# Patient Record
Sex: Male | Born: 1990 | Race: White | Hispanic: No | Marital: Single | Smoking: Former smoker
Health system: Southern US, Community
[De-identification: ages and names within clinical notes are randomized; demographics above are authoritative.]

---
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
## 2012-03-17 ENCOUNTER — Emergency Department (HOSPITAL_COMMUNITY)
Admission: EM | Admit: 2012-03-17 | Discharge: 2012-03-17 | Discharge status: Absent without leave | Payer: 59 | Source: Home / Self Care | Attending: Emergency Medicine | Admitting: Emergency Medicine

## 2012-03-17 NOTE — ED Notes (Signed)
Pt called x 1 no answer

## 2015-09-04 ENCOUNTER — Ambulatory Visit (INDEPENDENT_AMBULATORY_CARE_PROVIDER_SITE_OTHER): Payer: 59

## 2015-09-04 ENCOUNTER — Ambulatory Visit (INDEPENDENT_AMBULATORY_CARE_PROVIDER_SITE_OTHER): Payer: 59 | Admitting: Family Medicine

## 2015-09-04 ENCOUNTER — Encounter: Payer: Self-pay | Admitting: Physician Assistant

## 2015-09-04 VITALS — BP 136/86 | HR 111 | Temp 98.2°F | Resp 16 | Ht 70.25 in | Wt 280.2 lb

## 2015-09-04 DIAGNOSIS — R059 Cough, unspecified: Secondary | ICD-10-CM

## 2015-09-04 DIAGNOSIS — J209 Acute bronchitis, unspecified: Secondary | ICD-10-CM | POA: Diagnosis not present

## 2015-09-04 DIAGNOSIS — R05 Cough: Secondary | ICD-10-CM

## 2015-09-04 DIAGNOSIS — J9801 Acute bronchospasm: Secondary | ICD-10-CM | POA: Diagnosis not present

## 2015-09-04 LAB — POCT CBC
Granulocyte percent: 61.9 %G (ref 37–80)
HCT, POC: 54.8 % — AB (ref 43.5–53.7)
Hemoglobin: 19.1 g/dL — AB (ref 14.1–18.1)
Lymph, poc: 4.4 — AB (ref 0.6–3.4)
MCH, POC: 30.5 pg (ref 27–31.2)
MCHC: 35 g/dL (ref 31.8–35.4)
MCV: 87.1 fL (ref 80–97)
MID (cbc): 0.6 (ref 0–0.9)
MPV: 8.2 fL (ref 0–99.8)
POC Granulocyte: 8.2 — AB (ref 2–6.9)
POC LYMPH PERCENT: 33.5 %L (ref 10–50)
POC MID %: 4.6 %M (ref 0–12)
Platelet Count, POC: 235 10*3/uL (ref 142–424)
RBC: 6.28 M/uL — AB (ref 4.69–6.13)
RDW, POC: 12.3 %
WBC: 13.2 10*3/uL — AB (ref 4.6–10.2)

## 2015-09-04 MED ORDER — ALBUTEROL SULFATE (2.5 MG/3ML) 0.083% IN NEBU
2.5000 mg | INHALATION_SOLUTION | Freq: Once | RESPIRATORY_TRACT | Status: AC
  Administered 2015-09-04: 2.5 mg via RESPIRATORY_TRACT

## 2015-09-04 MED ORDER — AZITHROMYCIN 250 MG PO TABS: ORAL_TABLET | ORAL | Status: AC

## 2015-09-04 MED ORDER — ALBUTEROL SULFATE HFA 108 (90 BASE) MCG/ACT IN AERS: 2.0000 | INHALATION_SPRAY | RESPIRATORY_TRACT | Status: DC | PRN

## 2015-09-04 MED ORDER — HYDROCOD POLST-CPM POLST ER 10-8 MG/5ML PO SUER: 5.0000 mL | Freq: Two times a day (BID) | ORAL | Status: DC | PRN

## 2015-09-04 MED ORDER — AZITHROMYCIN 250 MG PO TABS: ORAL_TABLET | ORAL | Status: DC

## 2015-09-04 MED ORDER — IPRATROPIUM BROMIDE 0.02 % IN SOLN
0.5000 mg | Freq: Once | RESPIRATORY_TRACT | Status: AC
  Administered 2015-09-04: 0.5 mg via RESPIRATORY_TRACT

## 2015-09-04 NOTE — Patient Instructions (Signed)
Drink plenty of water (64 oz/day) and get plenty of rest. If you have been prescribed a cough syrup, do not drive or operate heavy machinery while using this medication. Take antibiotic until finished. Use albuterol 2 puffs every 4 hours as needed for shortness of breath, wheezing, chest tightness If your symptoms are not improving in 7-10 days, call and let me know.

## 2015-09-04 NOTE — Progress Notes (Signed)
[facility] [address], [place] [address] [phone]  Date:  09/04/2015   Name:  Eric Payne Eric Payne   DOB:  [birth year]   MRN:  [id number]  PCP:  No primary care provider on file.    Chief Complaint: Cough and Nasal Congestion   History of Present Illness:  This is a 24 y.o. male who is presenting with cough x 1 week. Productive of green mucus. He is here with his mother who states he has been coughing nonstop and is getting no sleep. He is having wheezing and chest tightness. He had asthma as a baby but no problems since unless around a bonfire. He has never had to have an inhaler (since he was a baby) and never had to have a breathing treatment. He is also having mild sore throat, bilateral ear fullness, nasal congestion. He has not had fever but has felt hot. He gets wet and clammy but mom has been checking his temp and normal. Taking nyquil, dayquil, delsym and nothing helping. Has env allergies and takes zyrtec as needed. He is not a smoker but does chew tobacco.  Review of Systems:  Review of Systems See HPI  There are no active problems to display for this patient.   Prior to Admission medications   Medication Sig Start Date End Date Taking? Authorizing Provider  Cetirizine HCl (ZYRTEC PO) Take by mouth as needed.   Yes Historical Provider, MD  OVER THE COUNTER MEDICATION - see hpi as needed.   Yes Historical Provider, MD    No Known Allergies  History reviewed. No pertinent past surgical history.  Social History  Substance Use Topics  . Smoking status: Former Smoker -- 1 years    Types: Cigars, Cigarettes  . Smokeless tobacco: Current User    Types: Chew     Comment: chewing for 6 years  . Alcohol Use: 0.0 oz/week    0 Standard drinks or equivalent per week     Comment: beer    History reviewed. No pertinent family history.  Medication list has been reviewed and updated.  Physical Examination:  Physical Exam  Constitutional: He is  oriented to person, place, and time. He appears well-developed and well-nourished. No distress.  HENT:  Head: Normocephalic and atraumatic.  Right Ear: Hearing, external ear and ear canal normal. A middle ear effusion is present.  Left Ear: Hearing, external ear and ear canal normal. A middle ear effusion is present.  Nose: Nose normal.  Mouth/Throat: Uvula is midline and mucous membranes are normal. Posterior oropharyngeal erythema present. No oropharyngeal exudate or posterior oropharyngeal edema.  Eyes: Conjunctivae and lids are normal. Right eye exhibits no discharge. Left eye exhibits no discharge. No scleral icterus.  Cardiovascular: Regular rhythm, normal heart sounds and normal pulses.  Tachycardia present.   No murmur heard. Pulmonary/Chest: Effort normal. No respiratory distress. He has decreased breath sounds (decreased breath sounds at bilateral bases). He has no wheezes. He has no rhonchi. He has no rales.  Some wheezing in bilateral bases after duoneb  Musculoskeletal: Normal range of motion.  Lymphadenopathy:       Head (right side): No submental, no submandibular and no tonsillar adenopathy present.       Head (left side): No submental, no submandibular and no tonsillar adenopathy present.    He has no cervical adenopathy.  Neurological: He is alert and oriented to person, place, and time.  Skin: Skin is warm, dry and intact.  Skin diaphoretic  and flushed  Psychiatric: He has a normal mood and affect. His speech is normal and behavior is normal. Thought content normal.   BP 136/86 mmHg  Pulse 111  Temp(Src) 98.2 F (36.8 C) (Oral)  Resp 16  Ht 5' 10.25" (1.784 m)  Wt 280 lb 3.2 oz (127.098 kg)  BMI 39.93 kg/m2  SpO2 93%  O2 sats 93-94% after duoneb treatment  [facility] reading (PRIMARY) by  Dr. Katrinka Blazing: negative.  Results for orders placed or performed in visit on 09/04/15  POCT CBC  Result Value Ref Range   WBC 13.2 (A) 4.6 - 10.2 K/uL   Lymph, poc 4.4 (A) 0.6 - 3.4    POC LYMPH PERCENT 33.5 10 - 50 %L   MID (cbc) 0.6 0 - 0.9   POC MID % 4.6 0 - 12 %M   POC Granulocyte 8.2 (A) 2 - 6.9   Granulocyte percent 61.9 37 - 80 %G   RBC 6.28 (A) 4.69 - 6.13 M/uL   Hemoglobin 19.1 (A) 14.1 - 18.1 g/dL   HCT, POC 16.1 (A) 09.6 - 53.7 %   MCV 87.1 80 - 97 fL   MCH, POC 30.5 27 - 31.2 pg   MCHC 35.0 31.8 - 35.4 g/dL   RDW, POC 04.5 %   Platelet Count, POC 235 142 - 424 K/uL   MPV 8.2 0 - 99.8 fL   Assessment and Plan:  1. Acute bronchitis, unspecified organism 2. Cough 3. Bronchospasm Radiograph negative. Wbc 13.2 with left shift. Symptoms improved some after duoneb treatment. O2 sat remained about the same at 93-94%... Suspect could be d/t calloused fingers with black material around fingernails (he's a Teaching laboratory technician). Will treat with zpak, tussionex and albuterol. If not getting better in 1 week, he will let me know. - chlorpheniramine-HYDROcodone (TUSSIONEX PENNKINETIC ER) 10-8 MG/5ML SUER; Take 5 mLs by mouth every 12 (twelve) hours as needed for cough.  Dispense: 100 mL; Refill: 0 - azithromycin (ZITHROMAX) 250 MG tablet; Take 2 tabs PO x 1 dose, then 1 tab PO QD x 4 days  Dispense: 6 tablet; Refill: 0 - DG Chest 2 View; Future - albuterol (PROVENTIL) (2.5 MG/3ML) 0.083% nebulizer solution 2.5 mg; Take 3 mLs (2.5 mg total) by nebulization once. - ipratropium (ATROVENT) nebulizer solution 0.5 mg; Take 2.5 mLs (0.5 mg total) by nebulization once. - POCT CBC - albuterol (PROVENTIL HFA;VENTOLIN HFA) 108 (90 BASE) MCG/ACT inhaler; Inhale 2 puffs into the lungs every 4 (four) hours as needed for wheezing or shortness of breath (cough, shortness of breath or wheezing.).  Dispense: 1 Inhaler; Refill: 1   Tydus Sanmiguel V. Dyke Brackett, MHS [facility]  09/04/2015

## 2015-10-04 NOTE — Progress Notes (Signed)
History and physical examinations reviewed in detail with PA Bush.  CXR reviewed during visit. Agree with assessment and plan. Viren Lebeau Paulita FujitaMartin Yanel Dombrosky, M.D. [facility]  [place] [address] [place], [place]  [address] [phone] phone [phone] fax

## 2015-11-29 DIAGNOSIS — S90852A Superficial foreign body, left foot, initial encounter: Secondary | ICD-10-CM | POA: Diagnosis not present

## 2015-12-06 DIAGNOSIS — S90852D Superficial foreign body, left foot, subsequent encounter: Secondary | ICD-10-CM | POA: Diagnosis not present

## 2015-12-18 MED FILL — VENTOLIN HFA 90 MCG INHALER: 108 (90 BAS | 25 days supply | Qty: 18 | Fill #0

## 2015-12-25 ENCOUNTER — Other Ambulatory Visit: Payer: Self-pay

## 2015-12-25 DIAGNOSIS — J9801 Acute bronchospasm: Secondary | ICD-10-CM

## 2015-12-25 MED ORDER — ALBUTEROL SULFATE HFA 108 (90 BASE) MCG/ACT IN AERS: 2.0000 | INHALATION_SPRAY | RESPIRATORY_TRACT | Status: DC | PRN

## 2016-05-31 DIAGNOSIS — S51811A Laceration without foreign body of right forearm, initial encounter: Secondary | ICD-10-CM | POA: Diagnosis not present

## 2016-11-07 DIAGNOSIS — L400 Psoriasis vulgaris: Secondary | ICD-10-CM | POA: Diagnosis not present

## 2016-12-04 MED FILL — VENTOLIN HFA 90 MCG INHALER: 108 (90 BAS | 17 days supply | Qty: 18 | Fill #0

## 2016-12-06 DIAGNOSIS — H938X3 Other specified disorders of ear, bilateral: Secondary | ICD-10-CM | POA: Insufficient documentation

## 2016-12-06 DIAGNOSIS — R0982 Postnasal drip: Secondary | ICD-10-CM | POA: Diagnosis not present

## 2016-12-06 DIAGNOSIS — J343 Hypertrophy of nasal turbinates: Secondary | ICD-10-CM | POA: Insufficient documentation

## 2016-12-06 DIAGNOSIS — R0683 Snoring: Secondary | ICD-10-CM | POA: Insufficient documentation

## 2016-12-06 DIAGNOSIS — F17290 Nicotine dependence, other tobacco product, uncomplicated: Secondary | ICD-10-CM | POA: Diagnosis not present

## 2017-02-27 DIAGNOSIS — J343 Hypertrophy of nasal turbinates: Secondary | ICD-10-CM | POA: Diagnosis not present

## 2017-02-27 DIAGNOSIS — R0982 Postnasal drip: Secondary | ICD-10-CM | POA: Diagnosis not present

## 2017-08-25 ENCOUNTER — Other Ambulatory Visit: Payer: Self-pay

## 2017-08-25 ENCOUNTER — Ambulatory Visit (INDEPENDENT_AMBULATORY_CARE_PROVIDER_SITE_OTHER): Payer: 59 | Admitting: Physician Assistant

## 2017-08-25 ENCOUNTER — Encounter: Payer: Self-pay | Admitting: Physician Assistant

## 2017-08-25 ENCOUNTER — Ambulatory Visit (INDEPENDENT_AMBULATORY_CARE_PROVIDER_SITE_OTHER): Payer: 59

## 2017-08-25 VITALS — BP 120/80 | HR 101 | Temp 99.1°F | Resp 16 | Ht 71.5 in | Wt 308.8 lb

## 2017-08-25 DIAGNOSIS — R062 Wheezing: Secondary | ICD-10-CM

## 2017-08-25 DIAGNOSIS — R05 Cough: Secondary | ICD-10-CM

## 2017-08-25 DIAGNOSIS — J209 Acute bronchitis, unspecified: Secondary | ICD-10-CM | POA: Diagnosis not present

## 2017-08-25 DIAGNOSIS — R079 Chest pain, unspecified: Secondary | ICD-10-CM | POA: Diagnosis not present

## 2017-08-25 DIAGNOSIS — R059 Cough, unspecified: Secondary | ICD-10-CM

## 2017-08-25 LAB — POCT CBC
Granulocyte percent: 60.7 %G (ref 37–80)
HEMATOCRIT: 43.8 % (ref 43.5–53.7)
Hemoglobin: 15.2 g/dL (ref 14.1–18.1)
Lymph, poc: 2.8 (ref 0.6–3.4)
MCH, POC: 30.2 pg (ref 27–31.2)
MCHC: 34.6 g/dL (ref 31.8–35.4)
MCV: 87.3 fL (ref 80–97)
MID (cbc): 0.6 (ref 0–0.9)
MPV: 7.6 fL (ref 0–99.8)
POC GRANULOCYTE: 5.2 (ref 2–6.9)
POC LYMPH %: 32.6 % (ref 10–50)
POC MID %: 6.7 % (ref 0–12)
Platelet Count, POC: 264 10*3/uL (ref 142–424)
RBC: 5.02 M/uL (ref 4.69–6.13)
RDW, POC: 11.9 %
WBC: 8.6 10*3/uL (ref 4.6–10.2)

## 2017-08-25 LAB — GLUCOSE, POCT (MANUAL RESULT ENTRY): POC GLUCOSE: 99 mg/dL (ref 70–99)

## 2017-08-25 MED ORDER — HYDROCODONE-HOMATROPINE 5-1.5 MG/5ML PO SYRP: 5.0000 mL | ORAL_SOLUTION | Freq: Three times a day (TID) | ORAL | 0 refills | Status: DC | PRN

## 2017-08-25 MED ORDER — IPRATROPIUM BROMIDE 0.02 % IN SOLN
0.5000 mg | Freq: Once | RESPIRATORY_TRACT | Status: AC
  Administered 2017-08-25: 0.5 mg via RESPIRATORY_TRACT

## 2017-08-25 MED ORDER — ALBUTEROL SULFATE (2.5 MG/3ML) 0.083% IN NEBU
2.5000 mg | INHALATION_SOLUTION | Freq: Once | RESPIRATORY_TRACT | Status: AC
  Administered 2017-08-25: 2.5 mg via RESPIRATORY_TRACT

## 2017-08-25 MED ORDER — BENZONATATE 100 MG PO CAPS: 100.0000 mg | ORAL_CAPSULE | Freq: Three times a day (TID) | ORAL | 0 refills | Status: DC | PRN

## 2017-08-25 MED ORDER — PREDNISONE 20 MG PO TABS: 40.0000 mg | ORAL_TABLET | Freq: Every day | ORAL | 0 refills | Status: AC

## 2017-08-25 NOTE — Patient Instructions (Addendum)
- We will treat this as a respiratory viral infection.  - I recommend you rest, drink plenty of fluids, eat light meals including soups.  - We are going to treat your underlying inflammation with oral prednisone. Prednisone is a steroid and can cause side effects such as headache, irritability, nausea, vomiting, increased heart rate, increased blood pressure, increased blood sugar, appetite changes, and insomnia. Please take tablets in the morning with a full meal to help decrease the chances of these side effects. - You may use cough syrup at night for your cough and sore throat, Tessalon pearls during the day. Be aware that cough syrup can definitely make you drowsy and sleepy so do not drive or operate any heavy machinery if it is affecting you during the day.  - You may also use Tylenol or ibuprofen over-the-counter for your sore throat.   - Please let me know if you are not seeing any improvement or get worse in 7-10 days.       Acute Bronchitis, Adult Acute bronchitis is when air tubes (bronchi) in the lungs suddenly get swollen. The condition can make it hard to breathe. It can also cause these symptoms:  A cough.  Coughing up clear, yellow, or green mucus.  Wheezing.  Chest congestion.  Shortness of breath.  A fever.  Body aches.  Chills.  A sore throat.  Follow these instructions at home: Medicines  Take over-the-counter and prescription medicines only as told by your doctor.  If you were prescribed an antibiotic medicine, take it as told by your doctor. Do not stop taking the antibiotic even if you start to feel better. General instructions  Rest.  Drink enough fluids to keep your pee (urine) clear or pale yellow.  Avoid smoking and secondhand smoke. If you smoke and you need help quitting, ask your doctor. Quitting will help your lungs heal faster.  Use an inhaler, cool mist vaporizer, or humidifier as told by your doctor.  Keep all follow-up visits as told  by your doctor. This is important. How is this prevented? To lower your risk of getting this condition again:  Wash your hands often with soap and water. If you cannot use soap and water, use hand sanitizer.  Avoid contact with people who have cold symptoms.  Try not to touch your hands to your mouth, nose, or eyes.  Make sure to get the flu shot every year.  Contact a doctor if:  Your symptoms do not get better in 2 weeks. Get help right away if:  You cough up blood.  You have chest pain.  You have very bad shortness of breath.  You become dehydrated.  You faint (pass out) or keep feeling like you are going to pass out.  You keep throwing up (vomiting).  You have a very bad headache.  Your fever or chills gets worse. This information is not intended to replace advice given to you by your health care provider. Make sure you discuss any questions you have with your health care provider. Document Released: 03/11/2008 Document Revised: 05/01/2016 Document Reviewed: 03/13/2016 Elsevier Interactive Patient Education  2017 [employer]     IF you received an x-ray today, you will receive an invoice from [facility]. Please contact [facility] at [phone] with questions or concerns regarding your invoice.   IF you received labwork today, you will receive an invoice from [facility]. Please contact [facility] at [phone] with questions or concerns regarding your invoice.   Our billing staff  will not be able to assist you with questions regarding bills from these companies.  You will be contacted with the lab results as soon as they are available. The fastest way to get your results is to activate your My Chart account. Instructions are located on the last page of this paperwork. If you have not heard from [place] regarding the results in 2 weeks, please contact this office.

## 2017-08-25 NOTE — Progress Notes (Signed)
MRN: [id number] DOB: 1991-09-17  Subjective:   Eric Payne is a 26 y.o. male presenting for chief complaint of Cough (x 1 month nonproductive and sorethroat from coughing) and Chest Pain (x 2 days) .  Reports 2-3 week history of nonproductive cough. Has associated subjective Fever (subjective), sinus congestion, rhinorrhea, ear pain, sore throat, wheezing, shortness of breath and chest pain, chills, nausea and vomiting. Has tried albuterol inhaler,  mucincex, robitussin, cough drops, sudafed with no full relief. Denies  abdominal pain and diarrhea. Has not had sick contact with anyone. Has history of seasonal allergies, no history of asthma. Patient did not get flu shot this season. Current smoker.  In terms of chest pain. Has been having intermittent moments of pressure like sensation in chest. No relation to activity or cough. No associated symptoms. Pt is not physically active. No FH of heart disease.   Eric Payne has a current medication list which includes the following prescription(s): albuterol, cetirizine hcl, clobetasol, fluticasone, triamcinolone cream, and OVER THE COUNTER MEDICATION. Also has No Known Allergies.  Eric Payne  has no past medical history on file. Also  has no past surgical history on file.   Objective:   Vitals: BP 120/80 (BP Location: Left Arm, Patient Position: Sitting, Cuff Size: Large)   Pulse (!) 101   Temp 99.1 F (37.3 C) (Oral)   Resp 16   Ht 5' 11.5" (1.816 m)   Wt (!) 308 lb 12.8 oz (140.1 kg)   SpO2 98%   BMI 42.47 kg/m   Physical Exam  Constitutional: He is oriented to person, place, and time. He appears well-developed and well-nourished. He appears distressed ( Appears uncomfortable sitting on exam table.).  HENT:  Head: Normocephalic and atraumatic.  Right Ear: Tympanic membrane, external ear and ear canal normal. Tympanic membrane is not erythematous and not bulging.  Left Ear: Tympanic membrane, external ear and ear canal normal. Tympanic  membrane is not erythematous and not bulging.  Nose: Mucosal edema ( mild) present. Right sinus exhibits no maxillary sinus tenderness and no frontal sinus tenderness. Left sinus exhibits no maxillary sinus tenderness and no frontal sinus tenderness.  Mouth/Throat: Uvula is midline and mucous membranes are normal. Posterior oropharyngeal erythema present. Tonsils are 1+ on the right. Tonsils are 1+ on the left. No tonsillar exudate.  Eyes: Conjunctivae are normal.  Neck: Normal range of motion.  Cardiovascular: Normal rate, regular rhythm and normal heart sounds.  Pulmonary/Chest: Effort normal. No respiratory distress. He has decreased breath sounds. He has wheezes ( Noted throughout posterior left lung field. ). He has rhonchi ( Noted throughout posterior left lung field.). He has no rales. He exhibits no tenderness.  Lymphadenopathy:       Head (right side): No submental, no submandibular, no tonsillar, no preauricular, no posterior auricular and no occipital adenopathy present.       Head (left side): No submental, no submandibular, no tonsillar, no preauricular, no posterior auricular and no occipital adenopathy present.    He has no cervical adenopathy.       Right: No supraclavicular adenopathy present.       Left: No supraclavicular adenopathy present.  Neurological: He is alert and oriented to person, place, and time.  Skin: Skin is warm and dry.  Face appears flushed.  Psychiatric: He has a normal mood and affect.  Vitals reviewed.   Results for orders placed or performed in visit on 08/25/17 (from the past 24 hour(s))  POCT CBC  Status: None   Collection Time: 08/25/17  3:22 PM  Result Value Ref Range   WBC 8.6 4.6 - 10.2 K/uL   Lymph, poc 2.8 0.6 - 3.4   POC LYMPH PERCENT 32.6 10 - 50 %L   MID (cbc) 0.6 0 - 0.9   POC MID % 6.7 0 - 12 %M   POC Granulocyte 5.2 2 - 6.9   Granulocyte percent 60.7 37 - 80 %G   RBC 5.02 4.69 - 6.13 M/uL   Hemoglobin 15.2 14.1 - 18.1 g/dL    HCT, POC 16.143.8 09.643.5 - 53.7 %   MCV 87.3 80 - 97 fL   MCH, POC 30.2 27 - 31.2 pg   MCHC 34.6 31.8 - 35.4 g/dL   RDW, POC 04.511.9 %   Platelet Count, POC 264 142 - 424 K/uL   MPV 7.6 0 - 99.8 fL   Dg Chest 2 View  Result Date: 08/25/2017 CLINICAL DATA:  Cough, fever. EXAM: CHEST  2 VIEW COMPARISON:  Radiographs of September 04, 2015. FINDINGS: The heart size and mediastinal contours are within normal limits. Both lungs are clear. No pneumothorax or pleural effusion is noted. The visualized skeletal structures are unremarkable. IMPRESSION: No active cardiopulmonary disease. Electronically Signed   By: Lupita RaiderJames  Green Jr, M.D.   On: 08/25/2017 15:18    EKG shows sinus rhythm with rate of 94 bpm. PR and QRS intervals within normal limits. No prior EKG for comparison.  Findings presented and discussed with Dr. Katrinka BlazingSmith.   Wheezing improved post duoneb.  Mild diffuse wheezes still auscultated throughout left posterior lung. Pt appears less uncomfortable. Assessment and Plan :  1. Cough - DG Chest 2 View; Future - POCT CBC - POCT glucose (manual entry) - HYDROcodone-homatropine (HYCODAN) 5-1.5 MG/5ML syrup; Take 5 mLs every 8 (eight) hours as needed by mouth for cough.  Dispense: 120 mL; Refill: 0 - benzonatate (TESSALON) 100 MG capsule; Take 1-2 capsules (100-200 mg total) 3 (three) times daily as needed by mouth for cough.  Dispense: 40 capsule; Refill: 0 2. Chest pain, unspecified type EKG normal.  Chest pain likely associated with bronchospasm.  EKG 12-Lead 3. Wheezing Improved. - ipratropium (ATROVENT) nebulizer solution 0.5 mg - albuterol (PROVENTIL) (2.5 MG/3ML) 0.083% nebulizer solution 2.5 mg - predniSONE (DELTASONE) 20 MG tablet; Take 2 tablets (40 mg total) daily with breakfast for 5 days by mouth.  Dispense: 10 tablet; Refill: 0 4. Acute bronchitis, unspecified organism Pt's appearance improved after duoneb.Plain film with no acute finding. WBC wnl. Wheezing improved with duoneb. This is  likely still a viral etiology. Will attempt trial of prednisone, along with other symptomatic treatment, at this time. Pt encouraged to return to clinic if symptoms worsen, do not improve, or as needed  Benjiman CoreBrittany Abhijay Morriss, PA-C  [facility] at [facility] 08/27/2017 1:53 AM

## 2017-08-26 MED FILL — FLUTICASONE PROP 50 MCG SPR: 50 | 30 days supply | Qty: 16 | Fill #0

## 2017-08-27 ENCOUNTER — Encounter: Payer: Self-pay | Admitting: Physician Assistant

## 2017-09-17 ENCOUNTER — Other Ambulatory Visit: Payer: Self-pay | Admitting: Family Medicine

## 2017-09-17 DIAGNOSIS — J9801 Acute bronchospasm: Secondary | ICD-10-CM

## 2017-09-18 MED FILL — VENTOLIN HFA 90 MCG INHALER: 108 (90 BAS | 30 days supply | Qty: 18 | Fill #0

## 2017-12-11 DIAGNOSIS — H52223 Regular astigmatism, bilateral: Secondary | ICD-10-CM | POA: Diagnosis not present

## 2018-02-03 ENCOUNTER — Ambulatory Visit (INDEPENDENT_AMBULATORY_CARE_PROVIDER_SITE_OTHER): Payer: 59

## 2018-02-03 ENCOUNTER — Other Ambulatory Visit: Payer: Self-pay

## 2018-02-03 ENCOUNTER — Ambulatory Visit (INDEPENDENT_AMBULATORY_CARE_PROVIDER_SITE_OTHER): Payer: 59 | Admitting: Physician Assistant

## 2018-02-03 ENCOUNTER — Encounter: Payer: Self-pay | Admitting: Physician Assistant

## 2018-02-03 VITALS — BP 132/80 | HR 80 | Temp 97.7°F | Resp 18 | Ht 71.5 in | Wt 308.8 lb

## 2018-02-03 DIAGNOSIS — R079 Chest pain, unspecified: Secondary | ICD-10-CM

## 2018-02-03 MED ORDER — SUCRALFATE 1 GM/10ML PO SUSP: 1.0000 g | Freq: Three times a day (TID) | ORAL | 0 refills | Status: DC

## 2018-02-03 MED ORDER — GI COCKTAIL ~~LOC~~
30.0000 mL | Freq: Once | ORAL | Status: AC
  Administered 2018-02-03: 30 mL via ORAL

## 2018-02-03 MED ORDER — RANITIDINE HCL 150 MG PO TABS: 150.0000 mg | ORAL_TABLET | Freq: Two times a day (BID) | ORAL | 0 refills | Status: DC

## 2018-02-03 MED FILL — SUCRALFATE 1 GM TABLET: 1 | 11 days supply | Qty: 44 | Fill #0

## 2018-02-03 MED FILL — raNITIdine HCL 150 MG TABS: 150 | 30 days supply | Qty: 60 | Fill #0

## 2018-02-03 NOTE — Progress Notes (Signed)
Eric Payne  MRN: [id number] DOB: [birth year]  PCP: Patient, No Pcp Per  Chief Complaint  Patient presents with  . Chest Pain    2-3 days having a pushing feeling on chest and left shoulder and arm are sore     Subjective:  Pt presents to clinic for chest pain that started about 5 days ago while he was traveling.  It was intermittent through the wknd.  At work this am the pain got worse - more intense and lasted longer - as it has not really stopped  It is left sided - it feels like someones fist is pushing in his chest - at work he got a little nauseated and the pain started going down his left arm.  He is having some SOB - really for the last 45 mins.  No palpitations.  He is currently better than it was when the pain started this morning though it has not resolved.  He ate breakfast this am of eggs, sausage and toast - he does not typically eat breakfast but when he does it is this - this pain started 15-20 mins after he ate this.  The pain does not feel like heartburn as he has had that before and it felt different than this.  He has chest pain in the fall but it was sharp and stabbing and related to breathing - this feels different.  Not a regular smoker - 2-3 a day for a while - but now maybe 1 cigarette a month -seems to be more when he is drinking with certain friends. Smoked marijuana - no cocaine or heroin. He used his inhaler but it did not help.  Family history - no cardiac history  History is obtained by patient.  Review of Systems  Respiratory: Positive for shortness of breath.   Cardiovascular: Positive for chest pain.  Gastrointestinal: Positive for nausea (mild).    Patient Active Problem List   Diagnosis Date Noted  . Snoring 12/06/2016    Current Outpatient Medications on File Prior to Visit  Medication Sig Dispense Refill  . Cetirizine HCl (ZYRTEC PO) Take by mouth as needed.    . fluticasone (FLONASE) 50 MCG/ACT nasal spray Place into the nose.    .  VENTOLIN HFA 108 (90 Base) MCG/ACT inhaler INHALE 2 PUFFS INTO THE LUNGS EVERY 4 HOURS AS NEEDED (COUGH, SHORTNESS OF BREATH OR WHEEZING.). 18 g 1   No current facility-administered medications on file prior to visit.     No Known Allergies  History reviewed. No pertinent past medical history. Social History   Social History Narrative  . Not on file   Social History   Tobacco Use  . Smoking status: Former Smoker    Years: 1.00    Types: Cigars, Cigarettes  . Smokeless tobacco: Current User    Types: Chew  . Tobacco comment: chewing for 6 years  Substance Use Topics  . Alcohol use: Yes    Alcohol/week: 0.0 oz    Comment: beer  . Drug use: Not on file   family history is not on file.     Objective:  BP 132/80   Pulse (!) 105   Temp 97.7 F (36.5 C) (Oral)   Resp 18   Ht 5' 11.5" (1.816 m)   Wt (!) 308 lb 12.8 oz (140.1 kg)   SpO2 97%   BMI 42.47 kg/m  Body mass index is 42.47 kg/m.  Physical Exam  Constitutional: He is oriented to person, place,  and time. He appears well-developed and well-nourished.  HENT:  Head: Normocephalic and atraumatic.  Right Ear: External ear normal.  Left Ear: External ear normal.  Eyes: Conjunctivae are normal.  Neck: Normal range of motion.  Cardiovascular: Normal rate, regular rhythm and normal heart sounds.  No murmur heard. Pulmonary/Chest: Effort normal and breath sounds normal. He has no decreased breath sounds. He has no wheezes.  No TTP over chest wall  Abdominal: Soft. Bowel sounds are normal. There is no tenderness.  Neurological: He is alert and oriented to person, place, and time.  Skin: Skin is warm and dry.  Psychiatric: Judgment normal.  Vitals reviewed.   Rhythm: NSR at a rate of 80. Findings: NSR without acute changes Last EKG: 08/2017  Changes from last EKG: No I have personally reviewed the EKG tracing and agree with the computerized printout.  GI cocktail given.  Patient has almost complete pain  resolution with GI cocktail.  Dg Chest 2 View  Result Date: 02/03/2018 CLINICAL DATA:  Chest pain EXAM: CHEST - 2 VIEW COMPARISON:  08/25/2017 FINDINGS: The heart size and mediastinal contours are within normal limits. Both lungs are clear. The visualized skeletal structures are unremarkable. IMPRESSION: No active cardiopulmonary disease. Electronically Signed   By: Franchot Gallo M.D.   On: 02/03/2018 12:57    Assessment and Plan :  Chest pain, unspecified type - Plan: EKG 12-Lead, gi cocktail (Maalox,Lidocaine,Donnatal), CMP14+EGFR, CBC with Differential/Platelet, Lipid panel, Hemoglobin A1c  Obesity, Class III, BMI 40-49.9 (morbid obesity) (HCC)   Unsure of exact etiology of chest pain though patient does get improvement with GI cocktail he has a negative EKG normal chest x-ray we will use Carafate if his pain returns before meals and before bedtime otherwise he will use Zantac twice a day for at least 2 weeks he will follow-up if things get worse or if they do not improve.  His questions were answered and he understands the plan.  Patient is interested in why he is unable to eat more food compared to his roommate who is incredibly skinny he is interested in some perhaps weight loss techniques he at this time would like to think about it he may contact me back to be referred to the weight management clinic through [facility].  D/w Dr Ermalinda Memos PA-C  Primary Care at [facility] 02/03/2018 12:16 PM

## 2018-02-03 NOTE — Patient Instructions (Addendum)
Use the liquid medication 4 times a day if the pain returns like it was this am - if the same does not return you do not have to take this medication.  The pill medication you will take 2x/day and you will take it for at least 2 weeks.  Let me know about the weight management clinic if that is something that you are interested in in the future.  Please download the APP called mychart - then use the text to activate this APP - this will allow you to look at your labs and contact me as well as make appointments to see me in the future.     IF you received an x-ray today, you will receive an invoice from [facility]. Please contact [facility] at [phone] with questions or concerns regarding your invoice.   IF you received labwork today, you will receive an invoice from [facility]. Please contact [facility] at [phone] with questions or concerns regarding your invoice.   Our billing staff will not be able to assist you with questions regarding bills from these companies.  You will be contacted with the lab results as soon as they are available. The fastest way to get your results is to activate your My Chart account. Instructions are located on the last page of this paperwork. If you have not heard from [place] regarding the results in 2 weeks, please contact this office.

## 2018-02-04 LAB — CBC WITH DIFFERENTIAL/PLATELET
BASOS ABS: 0 10*3/uL (ref 0.0–0.2)
Basos: 0 %
EOS (ABSOLUTE): 0.1 10*3/uL (ref 0.0–0.4)
EOS: 1 %
Hematocrit: 47.4 % (ref 37.5–51.0)
Hemoglobin: 16.5 g/dL (ref 13.0–17.7)
IMMATURE GRANULOCYTES: 1 %
Immature Grans (Abs): 0.1 10*3/uL (ref 0.0–0.1)
LYMPHS ABS: 2.6 10*3/uL (ref 0.7–3.1)
Lymphs: 26 %
MCH: 30.4 pg (ref 26.6–33.0)
MCHC: 34.8 g/dL (ref 31.5–35.7)
MCV: 88 fL (ref 79–97)
MONOS ABS: 0.9 10*3/uL (ref 0.1–0.9)
Monocytes: 9 %
NEUTROS PCT: 63 %
Neutrophils Absolute: 6.3 10*3/uL (ref 1.4–7.0)
PLATELETS: 241 10*3/uL (ref 150–379)
RBC: 5.42 x10E6/uL (ref 4.14–5.80)
RDW: 13.2 % (ref 12.3–15.4)
WBC: 9.9 10*3/uL (ref 3.4–10.8)

## 2018-02-04 LAB — CMP14+EGFR
ALK PHOS: 87 IU/L (ref 39–117)
ALT: 42 IU/L (ref 0–44)
AST: 27 IU/L (ref 0–40)
Albumin/Globulin Ratio: 1.8 (ref 1.2–2.2)
Albumin: 4.7 g/dL (ref 3.5–5.5)
BILIRUBIN TOTAL: 0.8 mg/dL (ref 0.0–1.2)
BUN/Creatinine Ratio: 17 (ref 9–20)
BUN: 14 mg/dL (ref 6–20)
CHLORIDE: 99 mmol/L (ref 96–106)
CO2: 24 mmol/L (ref 20–29)
CREATININE: 0.83 mg/dL (ref 0.76–1.27)
Calcium: 9.6 mg/dL (ref 8.7–10.2)
GFR calc Af Amer: 142 mL/min/{1.73_m2} (ref >59)
GFR calc non Af Amer: 123 mL/min/{1.73_m2} (ref >59)
GLOBULIN, TOTAL: 2.6 g/dL (ref 1.5–4.5)
GLUCOSE: 82 mg/dL (ref 65–99)
Potassium: 4.2 mmol/L (ref 3.5–5.2)
SODIUM: 138 mmol/L (ref 134–144)
Total Protein: 7.3 g/dL (ref 6.0–8.5)

## 2018-02-04 LAB — LIPID PANEL
CHOLESTEROL TOTAL: 248 mg/dL — AB (ref 100–199)
Chol/HDL Ratio: 5.4 ratio — ABNORMAL HIGH (ref 0.0–5.0)
HDL: 46 mg/dL (ref >39)
TRIGLYCERIDES: 424 mg/dL — AB (ref 0–149)

## 2018-02-04 LAB — HEMOGLOBIN A1C
Est. average glucose Bld gHb Est-mCnc: 100 mg/dL
Hgb A1c MFr Bld: 5.1 % (ref 4.8–5.6)

## 2018-02-20 ENCOUNTER — Encounter: Payer: Self-pay | Admitting: Family Medicine

## 2018-02-25 ENCOUNTER — Encounter: Payer: Self-pay | Admitting: Family Medicine

## 2018-06-29 ENCOUNTER — Encounter: Payer: Self-pay | Admitting: Physician Assistant

## 2018-06-30 ENCOUNTER — Encounter: Payer: Self-pay | Admitting: Family Medicine

## 2018-06-30 ENCOUNTER — Ambulatory Visit (INDEPENDENT_AMBULATORY_CARE_PROVIDER_SITE_OTHER): Payer: 59 | Admitting: Family Medicine

## 2018-06-30 VITALS — BP 125/87 | HR 86 | Temp 98.1°F | Resp 17 | Ht 71.5 in | Wt 318.0 lb

## 2018-06-30 DIAGNOSIS — R5381 Other malaise: Secondary | ICD-10-CM | POA: Diagnosis not present

## 2018-06-30 DIAGNOSIS — W57XXXA Bitten or stung by nonvenomous insect and other nonvenomous arthropods, initial encounter: Secondary | ICD-10-CM

## 2018-06-30 DIAGNOSIS — R51 Headache: Secondary | ICD-10-CM

## 2018-06-30 DIAGNOSIS — R519 Headache, unspecified: Secondary | ICD-10-CM

## 2018-06-30 LAB — POC INFLUENZA A&B (BINAX/QUICKVUE)
Influenza A, POC: NEGATIVE
Influenza B, POC: NEGATIVE

## 2018-06-30 MED ORDER — DOXYCYCLINE HYCLATE 100 MG PO TABS: 100.0000 mg | ORAL_TABLET | Freq: Two times a day (BID) | ORAL | 0 refills | Status: DC

## 2018-06-30 NOTE — Progress Notes (Signed)
Subjective:  By signing my name below, I, Essence Howell, attest that this documentation has been prepared under the direction and in the presence of Shade Flood, MD Electronically Signed: Charline Bills, ED Scribe 06/30/2018 at 11:43 AM.   Patient ID: Eric Payne, male    DOB: 1991/04/10, 27 y.o.   MRN: [id number]  Chief Complaint  Patient presents with  . patient thinks he was bit by a tick   HPI Eric Payne is a 27 y.o. male who presents to Primary Care at [facility] complaining of tick bite that he removed 3 days ago. Pt states that he has been in the woods a lot since June. States that he killed a deer 6 days ago that was covered in ticks. Also reports that he recently started working security for [employer] and found a tick to the R leg 3 nights ago which had been present for at least 5 hrs. He has noticed HA, nausea, fatigue, sweats since yesterday. Denies fever, congestion, rhinorrhea, abdominal pain, blood in stools, vomiting, hematuria, difficulty urinating, rash.  Patient Active Problem List   Diagnosis Date Noted  . Snoring 12/06/2016   History reviewed. No pertinent past medical history. History reviewed. No pertinent surgical history. No Known Allergies Prior to Admission medications   Medication Sig Start Date End Date Taking? Authorizing Provider  Cetirizine HCl (ZYRTEC PO) Take by mouth as needed.   Yes [provider]  fluticasone (FLONASE) 50 MCG/ACT nasal spray Place into the nose. 12/06/16  Yes [provider]  ranitidine (ZANTAC) 150 MG tablet Take 1 tablet (150 mg total) by mouth 2 (two) times daily. 02/03/18  Yes Weber, Sarah L, PA-C  sucralfate (CARAFATE) 1 GM/10ML suspension Take 10 mLs (1 g total) by mouth 4 (four) times daily -  with meals and at bedtime. 02/03/18  Yes Weber, Sarah L, PA-C  VENTOLIN HFA 108 (90 Base) MCG/ACT inhaler INHALE 2 PUFFS INTO THE LUNGS EVERY 4 HOURS AS NEEDED (COUGH, SHORTNESS OF BREATH OR WHEEZING.). 09/18/17   Yes Magdalene River, PA-C   Social History   Socioeconomic History  . Marital status: Single    Spouse name: Not on file  . Number of children: Not on file  . Years of education: Not on file  . Highest education level: Not on file  Occupational History  . Not on file  Social Needs  . Financial resource strain: Not on file  . Food insecurity:    Worry: Not on file    Inability: Not on file  . Transportation needs:    Medical: Not on file    Non-medical: Not on file  Tobacco Use  . Smoking status: Former Smoker    Years: 1.00    Types: Cigars, Cigarettes  . Smokeless tobacco: Current User    Types: Chew  . Tobacco comment: chewing for 6 years  Substance and Sexual Activity  . Alcohol use: Yes    Alcohol/week: 0.0 standard drinks    Comment: beer  . Drug use: Not on file  . Sexual activity: Not on file  Lifestyle  . Physical activity:    Days per week: Not on file    Minutes per session: Not on file  . Stress: Not on file  Relationships  . Social connections:    Talks on phone: Not on file    Gets together: Not on file    Attends religious service: Not on file    Active member of club  or organization: Not on file    Attends meetings of clubs or organizations: Not on file    Relationship status: Not on file  . Intimate partner violence:    Fear of current or ex partner: Not on file    Emotionally abused: Not on file    Physically abused: Not on file    Forced sexual activity: Not on file  Other Topics Concern  . Not on file  Social History Narrative  . Not on file   Review of Systems  Constitutional: Positive for diaphoresis and fatigue. Negative for fever.  HENT: Negative for congestion and rhinorrhea.   Gastrointestinal: Positive for nausea. Negative for abdominal pain, blood in stool and vomiting.  Genitourinary: Negative for difficulty urinating and hematuria.  Skin: Negative for rash.  Neurological: Positive for headaches.      Objective:    Physical Exam  Constitutional: He is oriented to person, place, and time. He appears well-developed and well-nourished.  HENT:  Head: Normocephalic and atraumatic.  Right Ear: Tympanic membrane, external ear and ear canal normal.  Left Ear: Tympanic membrane, external ear and ear canal normal.  Nose: No rhinorrhea.  Mouth/Throat: Oropharynx is clear and moist and mucous membranes are normal. No oropharyngeal exudate or posterior oropharyngeal erythema.  Eyes: Pupils are equal, round, and reactive to light. Conjunctivae are normal.  Neck: Neck supple.  Cardiovascular: Normal rate, regular rhythm, normal heart sounds and intact distal pulses.  No murmur heard. Pulmonary/Chest: Effort normal and breath sounds normal. He has no wheezes. He has no rhonchi. He has no rales.  Abdominal: Soft. There is no tenderness. There is negative Murphy's sign.  Lymphadenopathy:    He has no cervical adenopathy.  Neurological: He is alert and oriented to person, place, and time.  Skin: Skin is warm and dry. No rash noted.  Psychiatric: He has a normal mood and affect. His behavior is normal.  Vitals reviewed.  Vitals:   06/30/18 1119  BP: 125/87  Pulse: 86  Resp: 17  Temp: 98.1 F (36.7 C)  TempSrc: Oral  SpO2: 98%  Weight: (!) 318 lb (144.2 kg)  Height: 5' 11.5" (1.816 m)   Results for orders placed or performed in visit on 06/30/18  POC Influenza A&B(BINAX/QUICKVUE)  Result Value Ref Range   Influenza A, POC Negative Negative   Influenza B, POC Negative Negative      Assessment & Plan:    [name] is a 27 y.o. male Nonintractable headache, unspecified chronicity pattern, unspecified headache type - Plan: POC Influenza A&B(BINAX/QUICKVUE)  Malaise - Plan: POC Influenza A&B(BINAX/QUICKVUE)  Tick bite, initial encounter  Most recent tick bite without apparent attachment for significant amount of time, but has possibility of other tick bites he may not have seen with tick exposures.   Given above symptoms we will treat with doxycycline to cover for possible early tickborne illness, specifically RMSF but reassuring exam at present.  RTC precautions/ER precautions discussed and information on tick bites provided.   Meds ordered this encounter  Medications  . doxycycline (VIBRA-TABS) 100 MG tablet    Sig: Take 1 tablet (100 mg total) by mouth 2 (two) times daily.    Dispense:  14 tablet    Refill:  0   Patient Instructions      With current symptoms and exposure to ticks, we can treat for possible [facility] spotted fever. Take antibiotics for 1 week, but if any fevers will need to remain on antibiotics for at least  3 days after fever has resolved.    Return to the clinic or go to the nearest emergency room if any of your symptoms worsen or new symptoms occur.    Tick Bite Information, Adult Ticks are insects that draw blood for food. Most ticks live in shrubs and grassy areas. They climb onto people and animals that brush against the leaves and grasses that they rest on. Then they bite, attaching themselves to the skin. Most ticks are harmless, but some ticks carry germs that can spread to a person through a bite and cause a disease. To reduce your risk of getting a disease from a tick bite, it is important to take steps to prevent tick bites. It is also important to check for ticks after being outdoors. If you find that a tick has attached to you, watch for symptoms of disease. How can I prevent tick bites? Take these steps to help prevent tick bites when you are outdoors in an area where ticks are found:  Use insect repellent that has DEET (20% or higher), picaridin, or IR3535 in it. Use it on: ? Skin that is showing. ? The top of your boots. ? Your pant legs. ? Your sleeve cuffs.  For repellent products that contain permethrin, follow product instructions. Use these products on: ? Clothing. ? Gear. ? Boots. ? Tents.  Wear protective clothing. Long  sleeves and long pants offer the best protection from ticks.  Wear light-colored clothing so you can see ticks more easily.  Tuck your pant legs into your socks.  If you go walking on a trail, stay in the middle of the trail so your skin, hair, and clothing do not touch the bushes.  Avoid walking through areas with long grass.  Check for ticks on your clothing, hair, and skin often while you are outside, and check again before you go inside. Make sure to check the places that ticks attach themselves most often. These places include the scalp, neck, armpits, waist, groin, and joint areas. Ticks that carry a disease called Lyme disease have to be attached to the skin for 24-48 hours. Checking for ticks every day will lessen your risk of this and other diseases.  When you come indoors, wash your clothes and take a shower or a bath right away. Dry your clothes in a dryer on high heat for at least 60 minutes. This will kill any ticks in your clothes.  What is the proper way to remove a tick? If you find a tick on your body, remove it as soon as possible. Removing a tick sooner rather than later can prevent germs from passing from the tick to your body. To remove a tick that is crawling on your skin but has not bitten:  Go outdoors and brush the tick off.  Remove the tick with tape or a lint roller.  To remove a tick that is attached to your skin:  Wash your hands.  If you have latex gloves, put them on.  Use tweezers, curved forceps, or a tick-removal tool to gently grasp the tick as close to your skin and the tick's head as possible.  Gently pull with steady, upward pressure until the tick lets go. When removing the tick: ? Take care to keep the tick's head attached to its body. ? Do not twist or jerk the tick. This can make the tick's head or mouth break off. ? Do not squeeze or crush the tick's body. This could force disease-carrying  fluids from the tick into your body.  Do not try to  remove a tick with heat, alcohol, petroleum jelly, or fingernail polish. Using these methods can cause the tick to salivate and regurgitate into your bloodstream, increasing your risk of getting a disease. What should I do after removing a tick?  Clean the bite area with soap and water, rubbing alcohol, or an iodine scrub.  If an antiseptic cream or ointment is available, apply a small amount to the bite site.  Wash and disinfect any instruments that you used to remove the tick. How should I dispose of a tick? To dispose of a live tick, use one of these methods:  Place it in rubbing alcohol.  Place it in a sealed bag or container.  Wrap it tightly in tape.  Flush it down the toilet.  Contact a health care provider if:  You have symptoms of a disease after a tick bite. Symptoms of a tick-borne disease can occur from moments after the tick bites to up to 30 days after a tick is removed. Symptoms include: ? Muscle, joint, or bone pain. ? Difficulty walking or moving your legs. ? Numbness in the legs. ? Paralysis. ? Red rash around the tick bite area that is shaped like a target or a "bull's-eye." ? Redness and swelling in the area of the tick bite. ? Fever. ? Repeated vomiting. ? Diarrhea. ? Weight loss. ? Tender, swollen lymph glands. ? Shortness of breath. ? Cough. ? Pain in the abdomen. ? Headache. ? Abnormal tiredness. ? A change in your level of consciousness. ? Confusion. Get help right away if:  You are not able to remove a tick.  A part of a tick breaks off and gets stuck in your skin.  Your symptoms get worse. Summary  Ticks may carry germs that can spread to a person through a bite and cause disease.  Wear protective clothing and use insect repellent to prevent tick bites. Follow product instructions.  If you find a tick on your body, remove it as soon as possible. If the tick is attached, do not try to remove with heat, alcohol, petroleum jelly, or  fingernail polish.  Remove the attached tick using tweezers, curved forceps, or a tick-removal tool. Gently pull with steady, upward pressure until the tick lets go. Do not twist or jerk the tick. Do not squeeze or crush the tick's body.  If you have symptoms after being bitten by a tick, contact a health care provider. This information is not intended to replace advice given to you by your health care provider. Make sure you discuss any questions you have with your health care provider. Document Released: 09/20/2000 Document Revised: 07/05/2016 Document Reviewed: 07/05/2016 Elsevier Interactive Patient Education  [employer]2018 [employer]   If you have lab work done today you will be contacted with your lab results within the next 2 weeks.  If you have not heard from us then please contact us. The fastest way to get your results is to register for My Chart.   IF you received an x-ray today, you will receive an invoice from [facility]. Please contact [facility] at [phone] with questions or concerns regarding your invoice.   IF you received labwork today, you will receive an invoice from [facility]. Please contact [facility] at [phone] with questions or concerns regarding your invoice.   Our billing staff will not be able to assist you with questions regarding bills from these companies.  You will  be contacted with the lab results as soon as they are available. The fastest way to get your results is to activate your My Chart account. Instructions are located on the last page of this paperwork. If you have not heard from [place] regarding the results in 2 weeks, please contact this office.       I personally performed the services described in this documentation, which was scribed in my presence. The recorded information has been reviewed and considered for accuracy and completeness, addended by me as needed, and agree with information above.  Signed,   Meredith Staggers,  MD Primary Care at [facility].  07/01/18 10:16 PM

## 2018-06-30 NOTE — Patient Instructions (Addendum)
With current symptoms and exposure to ticks, we can treat for possible [facility] spotted fever. Take antibiotics for 1 week, but if any fevers will need to remain on antibiotics for at least 3 days after fever has resolved.    Return to the clinic or go to the nearest emergency room if any of your symptoms worsen or new symptoms occur.    Tick Bite Information, Adult Ticks are insects that draw blood for food. Most ticks live in shrubs and grassy areas. They climb onto people and animals that brush against the leaves and grasses that they rest on. Then they bite, attaching themselves to the skin. Most ticks are harmless, but some ticks carry germs that can spread to a person through a bite and cause a disease. To reduce your risk of getting a disease from a tick bite, it is important to take steps to prevent tick bites. It is also important to check for ticks after being outdoors. If you find that a tick has attached to you, watch for symptoms of disease. How can I prevent tick bites? Take these steps to help prevent tick bites when you are outdoors in an area where ticks are found:  Use insect repellent that has DEET (20% or higher), picaridin, or IR3535 in it. Use it on: ? Skin that is showing. ? The top of your boots. ? Your pant legs. ? Your sleeve cuffs.  For repellent products that contain permethrin, follow product instructions. Use these products on: ? Clothing. ? Gear. ? Boots. ? Tents.  Wear protective clothing. Long sleeves and long pants offer the best protection from ticks.  Wear light-colored clothing so you can see ticks more easily.  Tuck your pant legs into your socks.  If you go walking on a trail, stay in the middle of the trail so your skin, hair, and clothing do not touch the bushes.  Avoid walking through areas with long grass.  Check for ticks on your clothing, hair, and skin often while you are outside, and check again before you go inside. Make  sure to check the places that ticks attach themselves most often. These places include the scalp, neck, armpits, waist, groin, and joint areas. Ticks that carry a disease called Lyme disease have to be attached to the skin for 24-48 hours. Checking for ticks every day will lessen your risk of this and other diseases.  When you come indoors, wash your clothes and take a shower or a bath right away. Dry your clothes in a dryer on high heat for at least 60 minutes. This will kill any ticks in your clothes.  What is the proper way to remove a tick? If you find a tick on your body, remove it as soon as possible. Removing a tick sooner rather than later can prevent germs from passing from the tick to your body. To remove a tick that is crawling on your skin but has not bitten:  Go outdoors and brush the tick off.  Remove the tick with tape or a lint roller.  To remove a tick that is attached to your skin:  Wash your hands.  If you have latex gloves, put them on.  Use tweezers, curved forceps, or a tick-removal tool to gently grasp the tick as close to your skin and the tick's head as possible.  Gently pull with steady, upward pressure until the tick lets go. When removing the tick: ? Take care to keep the tick's  head attached to its body. ? Do not twist or jerk the tick. This can make the tick's head or mouth break off. ? Do not squeeze or crush the tick's body. This could force disease-carrying fluids from the tick into your body.  Do not try to remove a tick with heat, alcohol, petroleum jelly, or fingernail polish. Using these methods can cause the tick to salivate and regurgitate into your bloodstream, increasing your risk of getting a disease. What should I do after removing a tick?  Clean the bite area with soap and water, rubbing alcohol, or an iodine scrub.  If an antiseptic cream or ointment is available, apply a small amount to the bite site.  Wash and disinfect any instruments  that you used to remove the tick. How should I dispose of a tick? To dispose of a live tick, use one of these methods:  Place it in rubbing alcohol.  Place it in a sealed bag or container.  Wrap it tightly in tape.  Flush it down the toilet.  Contact a health care provider if:  You have symptoms of a disease after a tick bite. Symptoms of a tick-borne disease can occur from moments after the tick bites to up to 30 days after a tick is removed. Symptoms include: ? Muscle, joint, or bone pain. ? Difficulty walking or moving your legs. ? Numbness in the legs. ? Paralysis. ? Red rash around the tick bite area that is shaped like a target or a "bull's-eye." ? Redness and swelling in the area of the tick bite. ? Fever. ? Repeated vomiting. ? Diarrhea. ? Weight loss. ? Tender, swollen lymph glands. ? Shortness of breath. ? Cough. ? Pain in the abdomen. ? Headache. ? Abnormal tiredness. ? A change in your level of consciousness. ? Confusion. Get help right away if:  You are not able to remove a tick.  A part of a tick breaks off and gets stuck in your skin.  Your symptoms get worse. Summary  Ticks may carry germs that can spread to a person through a bite and cause disease.  Wear protective clothing and use insect repellent to prevent tick bites. Follow product instructions.  If you find a tick on your body, remove it as soon as possible. If the tick is attached, do not try to remove with heat, alcohol, petroleum jelly, or fingernail polish.  Remove the attached tick using tweezers, curved forceps, or a tick-removal tool. Gently pull with steady, upward pressure until the tick lets go. Do not twist or jerk the tick. Do not squeeze or crush the tick's body.  If you have symptoms after being bitten by a tick, contact a health care provider. This information is not intended to replace advice given to you by your health care provider. Make sure you discuss any questions you  have with your health care provider. Document Released: 09/20/2000 Document Revised: 07/05/2016 Document Reviewed: 07/05/2016 Elsevier Interactive Patient Education  [employer]2018 [employer]   If you have lab work done today you will be contacted with your lab results within the next 2 weeks.  If you have not heard from us then please contact us. The fastest way to get your results is to register for My Chart.   IF you received an x-ray today, you will receive an invoice from [facility]. Please contact [facility] at [phone] with questions or concerns regarding your invoice.   IF you received labwork today, you will receive an invoice  from [facility]. Please contact [facility] at [phone] with questions or concerns regarding your invoice.   Our billing staff will not be able to assist you with questions regarding bills from these companies.  You will be contacted with the lab results as soon as they are available. The fastest way to get your results is to activate your My Chart account. Instructions are located on the last page of this paperwork. If you have not heard from [place] regarding the results in 2 weeks, please contact this office.

## 2018-08-07 ENCOUNTER — Encounter: Payer: Self-pay | Admitting: Family Medicine

## 2018-08-07 ENCOUNTER — Ambulatory Visit (INDEPENDENT_AMBULATORY_CARE_PROVIDER_SITE_OTHER): Payer: 59 | Admitting: Family Medicine

## 2018-08-07 ENCOUNTER — Other Ambulatory Visit: Payer: Self-pay

## 2018-08-07 VITALS — BP 118/81 | HR 68 | Temp 98.1°F | Ht 72.0 in | Wt 309.8 lb

## 2018-08-07 DIAGNOSIS — L729 Follicular cyst of the skin and subcutaneous tissue, unspecified: Secondary | ICD-10-CM | POA: Diagnosis not present

## 2018-08-07 DIAGNOSIS — Z23 Encounter for immunization: Secondary | ICD-10-CM

## 2018-08-07 NOTE — Patient Instructions (Addendum)
Bump on upper shoulder appears to be possible sebaceous cyst, but as we discussed a painful lipoma is also possible.  Neither of those are concerning, but if it is uncomfortable can refer you to general surgery to have that area evaluated for excision/removal.  If you notice redness, increased swelling, or acute changes, return for recheck.    Let me know the date of your tetanus shot and we can update your chart.  Flu vaccine was given today.  Thanks for coming in today.    Epidermal Cyst An epidermal cyst is sometimes called an epidermal inclusion cyst or an infundibular cyst. It is a sac made of skin tissue. The sac contains a substance called keratin. Keratin is a protein that is normally secreted through the hair follicles. When keratin becomes trapped in the top layer of skin (epidermis), it can form an epidermal cyst. Epidermal cysts are usually found on the face, neck, trunk, and genitals. These cysts are usually harmless (benign), and they may not cause symptoms unless they become infected. It is important not to pop epidermal cysts yourself. What are the causes? This condition may be caused by:  A blocked hair follicle.  A hair that curls and re-enters the skin instead of growing straight out of the skin (ingrown hair).  A blocked pore.  Irritated skin.  An injury to the skin.  Certain conditions that are passed along from parent to child (inherited).  Human papillomavirus (HPV).  What increases the risk? The following factors may make you more likely to develop an epidermal cyst:  Having acne.  Being overweight.  Wearing tight clothing.  What are the signs or symptoms? The only symptom of this condition may be a small, painless lump underneath the skin. When an epidermal cyst becomes infected, symptoms may include:  Redness.  Inflammation.  Tenderness.  Warmth.  Fever.  Keratin draining from the cyst. Keratin may look like a grayish-white, bad-smelling  substance.  Pus draining from the cyst.  How is this diagnosed? This condition is diagnosed with a physical exam. In some cases, you may have a sample of tissue (biopsy) taken from your cyst to be examined under a microscope or tested for bacteria. You may be referred to a health care provider who specializes in skin care (dermatologist). How is this treated? In many cases, epidermal cysts go away on their own without treatment. If a cyst becomes infected, treatment may include:  Opening and draining the cyst. After draining, minor surgery to remove the rest of the cyst may be done.  Antibiotic medicine to help prevent infection.  Injections of medicines (steroids) that help to reduce inflammation.  Surgery to remove the cyst. Surgery may be done if: ? The cyst becomes large. ? The cyst bothers you. ? There is a chance that the cyst could turn into cancer.  Follow these instructions at home:  Take over-the-counter and prescription medicines only as told by your health care provider.  If you were prescribed an antibiotic, use it as told by your health care provider. Do not stop using the antibiotic even if you start to feel better.  Keep the area around your cyst clean and dry.  Wear loose, dry clothing.  Do not try to pop your cyst.  Avoid touching your cyst.  Check your cyst every day for signs of infection.  Keep all follow-up visits as told by your health care provider. This is important. How is this prevented?  Wear clean, dry, clothing.  Avoid wearing tight clothing.  Keep your skin clean and dry. Shower or take baths every day.  Wash your body with a benzoyl peroxide wash when you shower or bathe. Contact a health care provider if:  Your cyst develops symptoms of infection.  Your condition is not improving or is getting worse.  You develop a cyst that looks different from other cysts you have had.  You have a fever. Get help right away if:  Redness  spreads from the cyst into the surrounding area. This information is not intended to replace advice given to you by your health care provider. Make sure you discuss any questions you have with your health care provider. Document Released: 08/24/2004 Document Revised: 05/22/2016 Document Reviewed: 07/26/2015 Elsevier Interactive Patient Education  [employer].   If you have lab work done today you will be contacted with your lab results within the next 2 weeks.  If you have not heard from [place] then please contact us. The fastest way to get your results is to register for My Chart.   IF you received an x-ray today, you will receive an invoice from [facility]. Please contact [facility] at [phone] with questions or concerns regarding your invoice.   IF you received labwork today, you will receive an invoice from [place]. Please contact [facility] at [phone] with questions or concerns regarding your invoice.   Our billing staff will not be able to assist you with questions regarding bills from these companies.  You will be contacted with the lab results as soon as they are available. The fastest way to get your results is to activate your My Chart account. Instructions are located on the last page of this paperwork. If you have not heard from [place] regarding the results in 2 weeks, please contact this office.

## 2018-08-07 NOTE — Progress Notes (Signed)
Subjective:  By signing my name below, I, Stann Ore, attest that this documentation has been prepared under the direction and in the presence of Meredith Staggers, MD. Electronically Signed: Stann Ore, Scribe. 08/07/2018 , 9:32 AM .  Patient was seen in Room 8 .   Patient ID: Eric Payne, male    DOB: 06-12-1991, 27 y.o.   MRN: [id number] Chief Complaint  Patient presents with  . left shoulder bump   HPI Eric Payne is a 27 y.o. male  Patient reports a worsening bump that has grown in size recently. He also notes having some pain in the area when he stretches his shoulder with a pulling sensation. He initially noticed it a while ago, roughly a year ago. He denies any other bumps in other areas. He denies wearing straps over the area.   Health Maintenance Tetanus: believes he had it done about 2-3 years ago, after treating for a cut over his right forearm.  Flu shot: updated today.   Patient Active Problem List   Diagnosis Date Noted  . Snoring 12/06/2016   No past medical history on file. No past surgical history on file. No Known Allergies Prior to Admission medications   Medication Sig Start Date End Date Taking? Authorizing Provider  Cetirizine HCl (ZYRTEC PO) Take by mouth as needed.    [provider]  doxycycline (VIBRA-TABS) 100 MG tablet Take 1 tablet (100 mg total) by mouth 2 (two) times daily. 06/30/18   Shade Flood, MD  fluticasone (FLONASE) 50 MCG/ACT nasal spray Place into the nose. 12/06/16   [provider]  ranitidine (ZANTAC) 150 MG tablet Take 1 tablet (150 mg total) by mouth 2 (two) times daily. 02/03/18   Weber, Dema Severin, PA-C  sucralfate (CARAFATE) 1 GM/10ML suspension Take 10 mLs (1 g total) by mouth 4 (four) times daily -  with meals and at bedtime. 02/03/18   Weber, Sarah L, PA-C  VENTOLIN HFA 108 (90 Base) MCG/ACT inhaler INHALE 2 PUFFS INTO THE LUNGS EVERY 4 HOURS AS NEEDED (COUGH, SHORTNESS OF BREATH OR WHEEZING.). 09/18/17    Magdalene River, PA-C   Social History   Socioeconomic History  . Marital status: Single    Spouse name: Not on file  . Number of children: Not on file  . Years of education: Not on file  . Highest education level: Not on file  Occupational History  . Not on file  Social Needs  . Financial resource strain: Not on file  . Food insecurity:    Worry: Not on file    Inability: Not on file  . Transportation needs:    Medical: Not on file    Non-medical: Not on file  Tobacco Use  . Smoking status: Former Smoker    Years: 1.00    Types: Cigars, Cigarettes  . Smokeless tobacco: Current User    Types: Chew  . Tobacco comment: chewing for 6 years  Substance and Sexual Activity  . Alcohol use: Yes    Alcohol/week: 0.0 standard drinks    Comment: beer  . Drug use: Not on file  . Sexual activity: Not on file  Lifestyle  . Physical activity:    Days per week: Not on file    Minutes per session: Not on file  . Stress: Not on file  Relationships  . Social connections:    Talks on phone: Not on file    Gets together: Not on file    Attends religious  service: Not on file    Active member of club or organization: Not on file    Attends meetings of clubs or organizations: Not on file    Relationship status: Not on file  . Intimate partner violence:    Fear of current or ex partner: Not on file    Emotionally abused: Not on file    Physically abused: Not on file    Forced sexual activity: Not on file  Other Topics Concern  . Not on file  Social History Narrative  . Not on file    Review of Systems  Constitutional: Negative for fatigue and unexpected weight change.  Eyes: Negative for visual disturbance.  Respiratory: Negative for cough, chest tightness and shortness of breath.   Cardiovascular: Negative for chest pain, palpitations and leg swelling.  Gastrointestinal: Negative for abdominal pain and blood in stool.  Skin:       Bump on left shoulder  Neurological:  Negative for dizziness, light-headedness and headaches.       Objective:   Physical Exam  Constitutional: He is oriented to person, place, and time. He appears well-developed and well-nourished. No distress.  HENT:  Head: Normocephalic and atraumatic.  Eyes: Pupils are equal, round, and reactive to light. EOM are normal.  Neck: Neck supple.  Cardiovascular: Normal rate.  Pulmonary/Chest: Effort normal. No respiratory distress.  Musculoskeletal: Normal range of motion.  Neurological: He is alert and oriented to person, place, and time.  Skin: Skin is warm and dry.  Left upper posterior shoulder: appears to be a cystic structure that measures approximately 1.5 cm across, no central fluctuance, no significant erythema, lateral shoulder non tender, [place]/AC clavicle non tender  Psychiatric: He has a normal mood and affect. His behavior is normal.  Nursing note and vitals reviewed.   Vitals:   08/07/18 0918  BP: 118/81  Pulse: 68  Temp: 98.1 F (36.7 C)  TempSrc: Oral  SpO2: 99%  Weight: (!) 309 lb 12.8 oz (140.5 kg)  Height: 6' (1.829 m)       Assessment & Plan:   Eric Payne is a 27 y.o. male Cyst of skin - Plan: Ambulatory referral to General Surgery  -Sebaceous cyst likely versus less likely painful lipoma.  No signs of infection at this time.  Refer to general surgery for evaluation for excision, RTC precautions given.  Needs flu shot - Plan: Flu Vaccine QUAD 36+ mos IM    No orders of the defined types were placed in this encounter.  Patient Instructions     Bump on upper shoulder appears to be possible sebaceous cyst, but as we discussed a painful lipoma is also possible.  Neither of those are concerning, but if it is uncomfortable can refer you to general surgery to have that area evaluated for excision/removal.  If you notice redness, increased swelling, or acute changes, return for recheck.    Let me know the date of your tetanus shot and we can update your  chart.  Flu vaccine was given today.  Thanks for coming in today.    Epidermal Cyst An epidermal cyst is sometimes called an epidermal inclusion cyst or an infundibular cyst. It is a sac made of skin tissue. The sac contains a substance called keratin. Keratin is a protein that is normally secreted through the hair follicles. When keratin becomes trapped in the top layer of skin (epidermis), it can form an epidermal cyst. Epidermal cysts are usually found on the face, neck, trunk, and  genitals. These cysts are usually harmless (benign), and they may not cause symptoms unless they become infected. It is important not to pop epidermal cysts yourself. What are the causes? This condition may be caused by:  A blocked hair follicle.  A hair that curls and re-enters the skin instead of growing straight out of the skin (ingrown hair).  A blocked pore.  Irritated skin.  An injury to the skin.  Certain conditions that are passed along from parent to child (inherited).  Human papillomavirus (HPV).  What increases the risk? The following factors may make you more likely to develop an epidermal cyst:  Having acne.  Being overweight.  Wearing tight clothing.  What are the signs or symptoms? The only symptom of this condition may be a small, painless lump underneath the skin. When an epidermal cyst becomes infected, symptoms may include:  Redness.  Inflammation.  Tenderness.  Warmth.  Fever.  Keratin draining from the cyst. Keratin may look like a grayish-white, bad-smelling substance.  Pus draining from the cyst.  How is this diagnosed? This condition is diagnosed with a physical exam. In some cases, you may have a sample of tissue (biopsy) taken from your cyst to be examined under a microscope or tested for bacteria. You may be referred to a health care provider who specializes in skin care (dermatologist). How is this treated? In many cases, epidermal cysts go away on their  own without treatment. If a cyst becomes infected, treatment may include:  Opening and draining the cyst. After draining, minor surgery to remove the rest of the cyst may be done.  Antibiotic medicine to help prevent infection.  Injections of medicines (steroids) that help to reduce inflammation.  Surgery to remove the cyst. Surgery may be done if: ? The cyst becomes large. ? The cyst bothers you. ? There is a chance that the cyst could turn into cancer.  Follow these instructions at home:  Take over-the-counter and prescription medicines only as told by your health care provider.  If you were prescribed an antibiotic, use it as told by your health care provider. Do not stop using the antibiotic even if you start to feel better.  Keep the area around your cyst clean and dry.  Wear loose, dry clothing.  Do not try to pop your cyst.  Avoid touching your cyst.  Check your cyst every day for signs of infection.  Keep all follow-up visits as told by your health care provider. This is important. How is this prevented?  Wear clean, dry, clothing.  Avoid wearing tight clothing.  Keep your skin clean and dry. Shower or take baths every day.  Wash your body with a benzoyl peroxide wash when you shower or bathe. Contact a health care provider if:  Your cyst develops symptoms of infection.  Your condition is not improving or is getting worse.  You develop a cyst that looks different from other cysts you have had.  You have a fever. Get help right away if:  Redness spreads from the cyst into the surrounding area. This information is not intended to replace advice given to you by your health care provider. Make sure you discuss any questions you have with your health care provider. Document Released: 08/24/2004 Document Revised: 05/22/2016 Document Reviewed: 07/26/2015 Elsevier Interactive Patient Education  [employer].   If you have lab work done today you will be  contacted with your lab results within the next 2 weeks.  If you have not  heard from [place] then please contact us. The fastest way to get your results is to register for My Chart.   IF you received an x-ray today, you will receive an invoice from [facility]. Please contact [facility] at [phone] with questions or concerns regarding your invoice.   IF you received labwork today, you will receive an invoice from [place]. Please contact [facility] at [phone] with questions or concerns regarding your invoice.   Our billing staff will not be able to assist you with questions regarding bills from these companies.  You will be contacted with the lab results as soon as they are available. The fastest way to get your results is to activate your My Chart account. Instructions are located on the last page of this paperwork. If you have not heard from [place] regarding the results in 2 weeks, please contact this office.       I personally performed the services described in this documentation, which was scribed in my presence. The recorded information has been reviewed and considered for accuracy and completeness, addended by me as needed, and agree with information above.  Signed,   Meredith Staggers, MD [facility].  08/07/18 9:38 AM

## 2018-08-11 MED FILL — VENTOLIN HFA 90 MCG INHALER: 108 (90 BAS | 17 days supply | Qty: 18 | Fill #1

## 2018-09-10 ENCOUNTER — Ambulatory Visit: Payer: 59 | Admitting: Family Medicine

## 2018-10-09 ENCOUNTER — Ambulatory Visit: Payer: 59 | Admitting: Family Medicine

## 2019-10-04 ENCOUNTER — Ambulatory Visit: Payer: 59 | Attending: Internal Medicine

## 2019-10-04 DIAGNOSIS — Z20822 Contact with and (suspected) exposure to covid-19: Secondary | ICD-10-CM

## 2019-10-06 LAB — NOVEL CORONAVIRUS, NAA: SARS-CoV-2, NAA: NOT DETECTED

## 2019-10-14 IMAGING — DX DG CHEST 2V
2 series · 2 of 2 positions shown · non-contrast
Comparison: Radiographs September 04, 2015.

CLINICAL DATA: Cough, fever.

EXAM:
CHEST  2 VIEW

[chest pa]
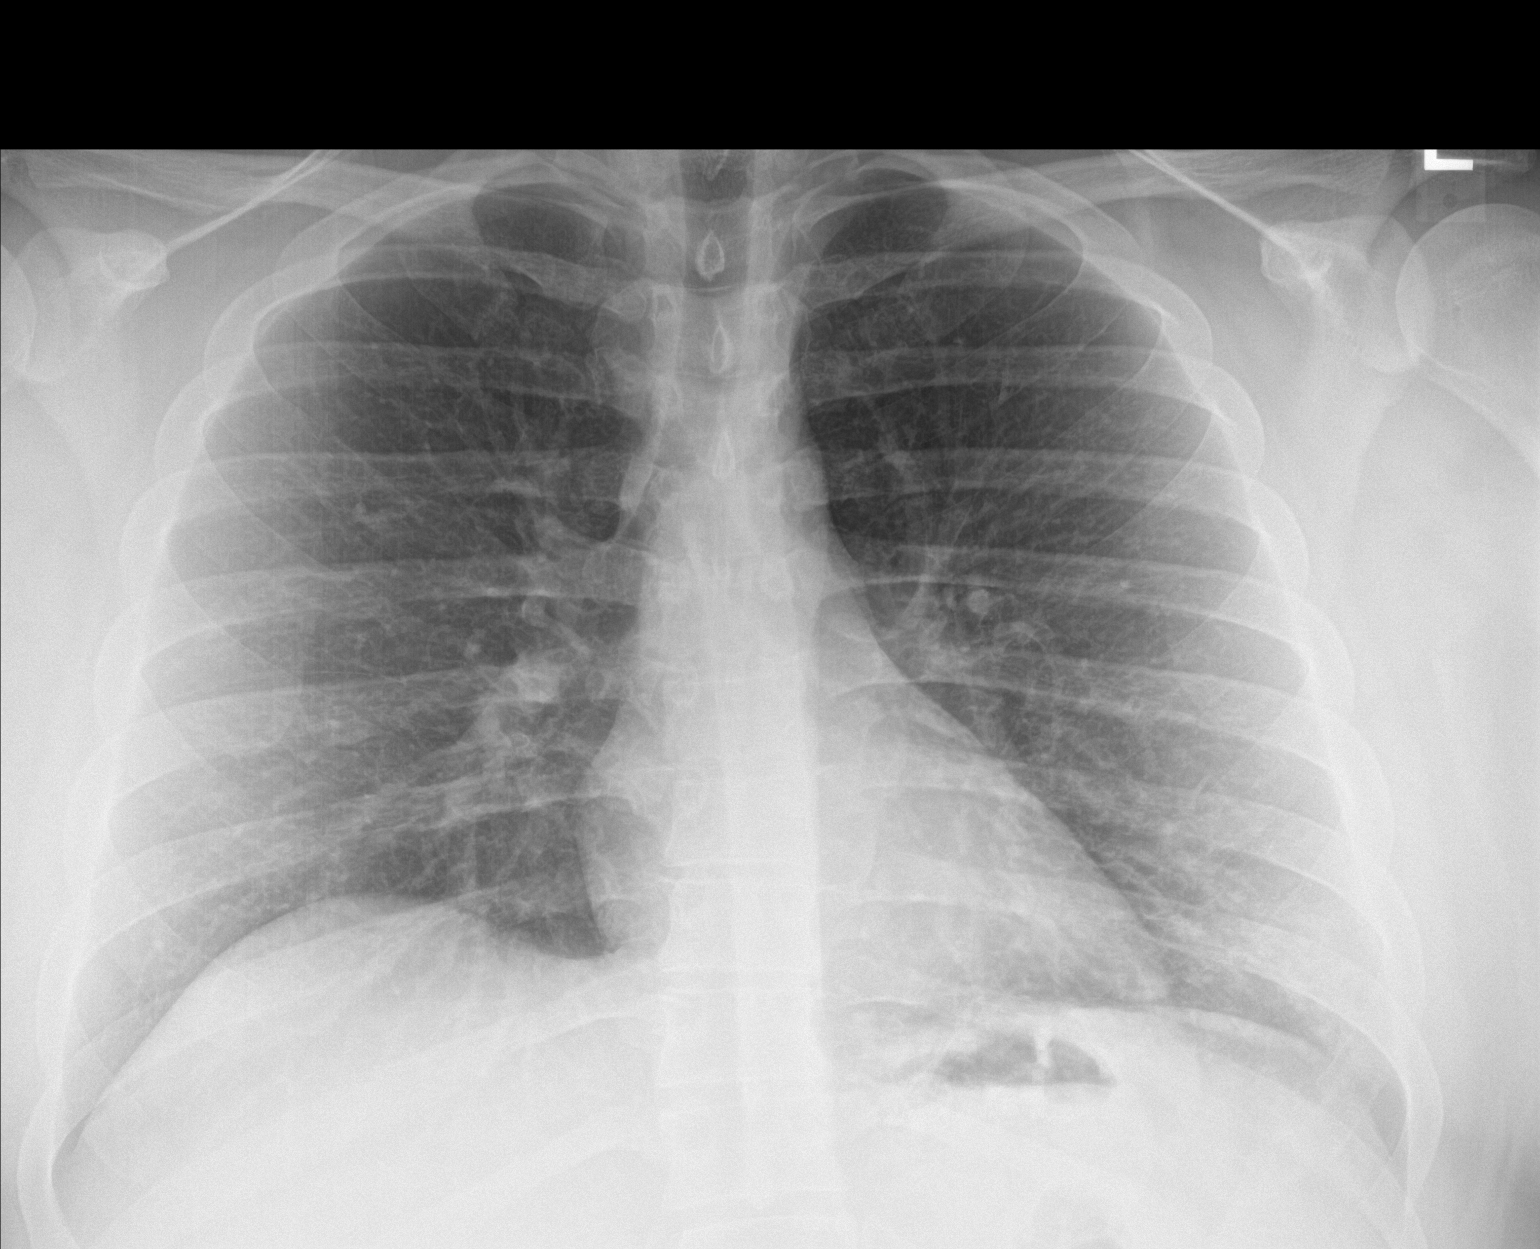

[chest lat]
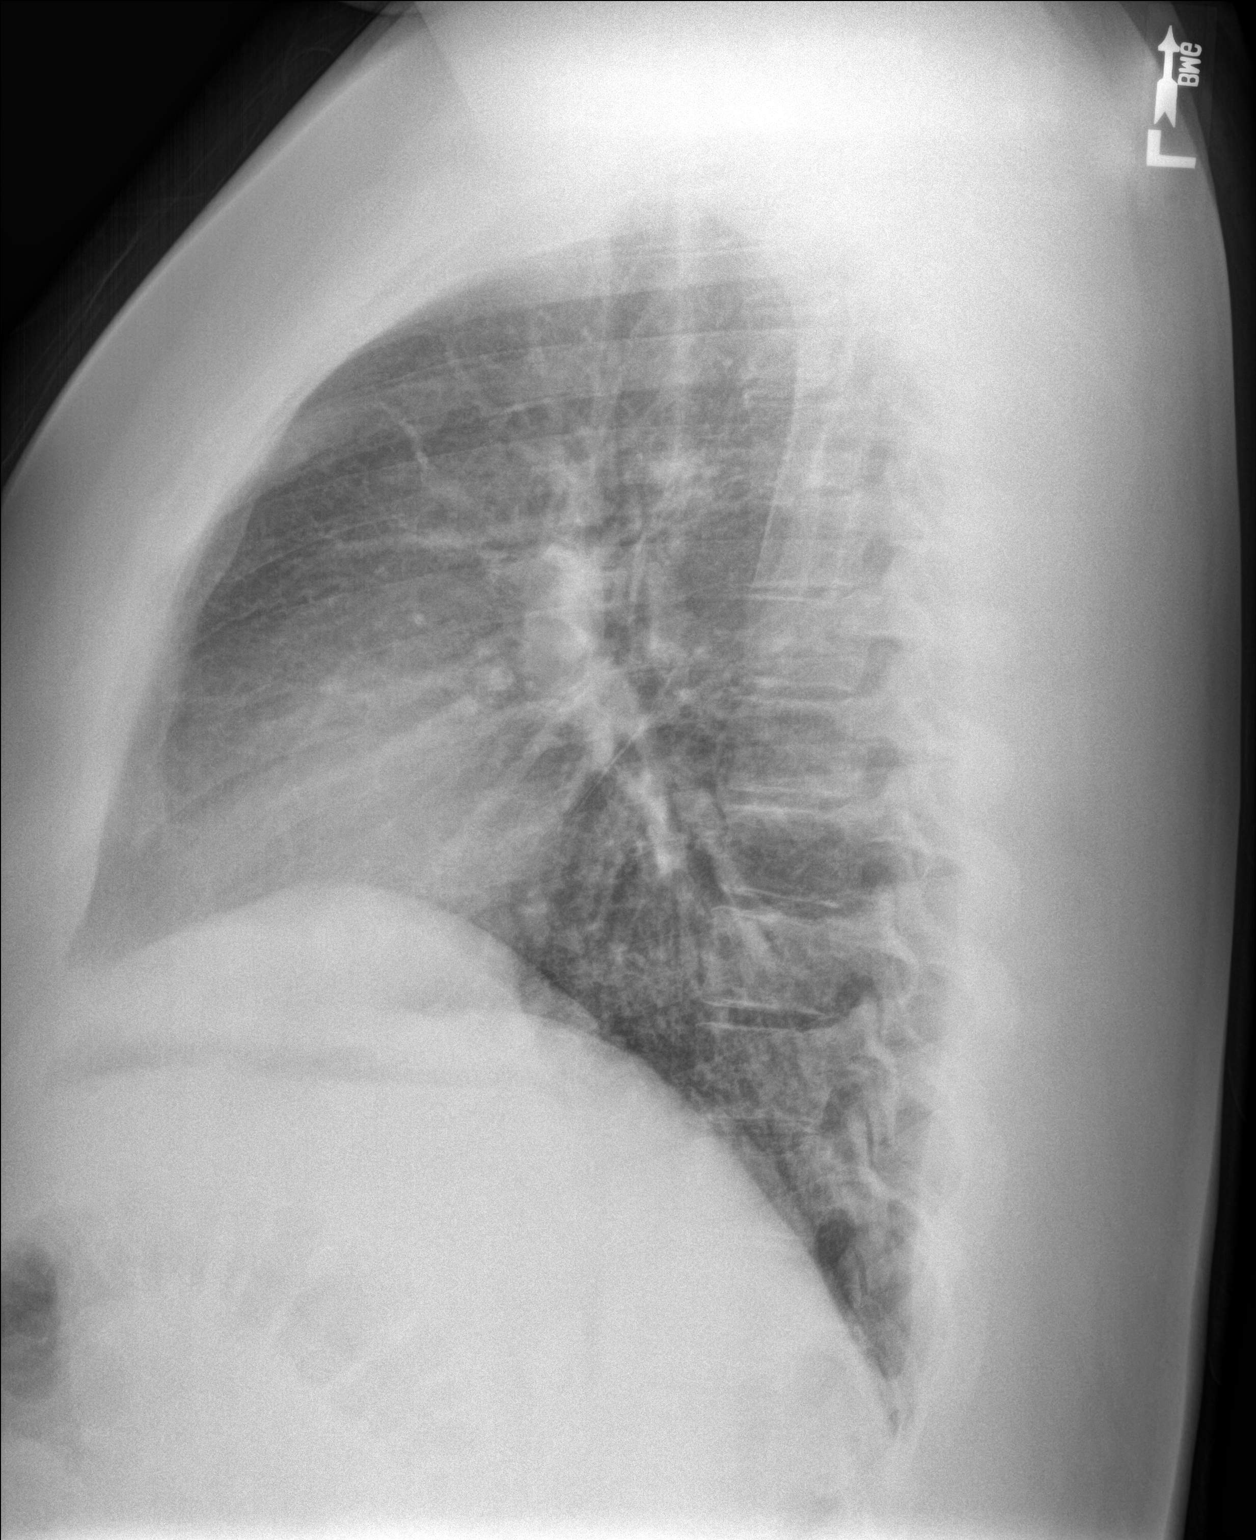

[2 of 2 positions shown; findings below may reference images not displayed]

FINDINGS: The heart size and mediastinal contours are within normal limits.
Both lungs are clear. No pneumothorax or pleural effusion is noted.
The visualized skeletal structures are unremarkable.
IMPRESSION: No active cardiopulmonary disease.

## 2021-06-21 ENCOUNTER — Encounter: Payer: 59 | Admitting: Family Medicine

## 2021-07-04 ENCOUNTER — Other Ambulatory Visit: Payer: Self-pay

## 2021-07-04 ENCOUNTER — Encounter: Payer: Self-pay | Admitting: Family Medicine

## 2021-07-04 ENCOUNTER — Ambulatory Visit: Payer: 59 | Admitting: Family Medicine

## 2021-07-04 VITALS — BP 128/66 | HR 85 | Temp 98.3°F | Resp 15 | Ht 72.0 in | Wt 289.2 lb

## 2021-07-04 DIAGNOSIS — L729 Follicular cyst of the skin and subcutaneous tissue, unspecified: Secondary | ICD-10-CM | POA: Diagnosis not present

## 2021-07-04 DIAGNOSIS — R12 Heartburn: Secondary | ICD-10-CM

## 2021-07-04 DIAGNOSIS — L409 Psoriasis, unspecified: Secondary | ICD-10-CM | POA: Diagnosis not present

## 2021-07-04 DIAGNOSIS — R079 Chest pain, unspecified: Secondary | ICD-10-CM

## 2021-07-04 NOTE — Progress Notes (Signed)
Subjective:  Patient ID: Eric Payne, male    DOB: 1991/03/04  Age: 30 y.o. MRN: [id number]  CC:  Chief Complaint  Patient presents with   Gastroesophageal Reflux    Pt here for chronic reflux, notes chest burning with this, pt reports constant has improved with some changes in eating habits, and cessation of chewing tobacco but is still severe    Cyst    Recheck cyst of shoulder, previously removed but has recurred     HPI Eric Payne presents for   GERD: Discussed at visit with Benny Lennert, PA-C in April 2019.  Chest pain at that time.  Improved with GI cocktail.  Negative EKG, normal chest x-ray.  Treated with Carafate, Zantac.  Had been doing well until few weeks ago.  Has some persistent heartburn, past several weeks has tried adjusting eating habits but still having some breakthrough symptoms. Decreased soda, stopped DIP tobacco. Grilled chicken, pork. Nausea a few times,  no vomiting. No abd pain. No fevers, no unexplained night sweats or weight loss. No cough/dyspnea. No hemoptysis, melena/hematochezia. Possible dark stool once orr twice after peptobismol only.  Symptoms come and go during the day - upper left chest, center of chest. Less with activity. Sometimes after eating - about . Burn sensation. No arm radiation, no associated dyspnea. Less severe than in 2019 - similar feeling.  Tx: omeprazole 20mg  OTC QD past 3 weeks.  Feels a lot better, but still there.  Alcohol: about once per week - feels better with alcohol - 3 beers per week.  Caffeine: none.  Spicy food, fried food - none past few weeks.   Skin cysts Had one on shoulder in past, referred to surgeon, drained it - in 2019. Same one in upper back returned past week or so, some drainage over that time. +new one on left shoulder noticed yesterday, and bump on back of left neck for years. No changes, but other areas on upper back and shoulder are sore.  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     History Patient Active Problem List   Diagnosis Date Noted   Snoring 12/06/2016   No past medical history on file. No past surgical history on file. No Known Allergies Prior to Admission medications   Medication Sig Start Date End Date Taking? Authorizing Provider  Cetirizine HCl (ZYRTEC PO) Take by mouth as needed.    [provider]  doxycycline (VIBRA-TABS) 100 MG tablet Take 1 tablet (100 mg total) by mouth 2 (two) times daily. 06/30/18   07/02/18, MD  fluticasone (FLONASE) 50 MCG/ACT nasal spray Place into the nose. 12/06/16   [provider]  ranitidine (ZANTAC) 150 MG tablet Take 1 tablet (150 mg total) by mouth 2 (two) times daily. 02/03/18   Weber, 02/05/18, PA-C  sucralfate (CARAFATE) 1 GM/10ML suspension Take 10 mLs (1 g total) by mouth 4 (four) times daily -  with meals and at bedtime. 02/03/18   Weber, Sarah L, PA-C  VENTOLIN HFA 108 (90 Base) MCG/ACT inhaler INHALE 2 PUFFS INTO THE LUNGS EVERY 4 HOURS AS NEEDED (COUGH, SHORTNESS OF BREATH OR WHEEZING.). 09/18/17   09/20/17, PA-C   Social History   Socioeconomic History   Marital status: Single    Spouse name: Not on file   Number of children: Not on file   Years of education: Not on file   Highest education level: Not on file  Occupational  History   Not on file  Tobacco Use   Smoking status: Former    Years: 1.00    Types: Cigars, Cigarettes   Smokeless tobacco: Current    Types: Chew   Tobacco comments:    chewing for 6 years  Substance and Sexual Activity   Alcohol use: Yes    Alcohol/week: 0.0 standard drinks    Comment: beer   Drug use: Not on file   Sexual activity: Not on file  Other Topics Concern   Not on file  Social History Narrative   Not on file   Social Determinants of Health   Financial Resource Strain: Not on file  Food Insecurity: Not on file  Transportation Needs: Not on file  Physical Activity: Not on file  Stress: Not on  file  Social Connections: Not on file  Intimate Partner Violence: Not on file    Review of Systems Per HPI.   Objective:   Vitals:   07/04/21 1346  BP: 128/66  Pulse: 85  Resp: 15  Temp: 98.3 F (36.8 C)  TempSrc: Temporal  SpO2: 96%  Weight: 289 lb 3.2 oz (131.2 kg)  Height: 6' (1.829 m)     Physical Exam Vitals reviewed.  Constitutional:      Appearance: He is well-developed. He is obese.  HENT:     Head: Normocephalic and atraumatic.  Neck:     Vascular: No carotid bruit or JVD.  Cardiovascular:     Rate and Rhythm: Normal rate and regular rhythm.     Heart sounds: Normal heart sounds. No murmur heard. Pulmonary:     Effort: Pulmonary effort is normal.     Breath sounds: Normal breath sounds. No rales.  Chest:     Comments: Chest wall nontender, unable to reproduce area of discomfort. Musculoskeletal:     Right lower leg: No edema.     Left lower leg: No edema.  Skin:    General: Skin is warm and dry.       Neurological:     Mental Status: He is alert and oriented to person, place, and time.  Psychiatric:        Mood and Affect: Mood normal.    EKG: Sinus rhythm, compared to 02/03/2018. -No acute findings or significant changes.   36 minutes spent during visit, including chart review, counseling and assimilation of information, exam, discussion of plan, and chart completion.    Assessment & Plan:  Eric Payne is a 30 y.o. male . Chest pain, unspecified type - Plan: EKG 12-Lead Heartburn  -Atypical pain, some improvement with treatment of reflux/heartburn triggers.  Likely heartburn but possible chest wall pain.  Add Zantac or Pepcid, continue omeprazole, handout given on other possible triggers.  Recheck 1 week, ER precautions given.  Consider gastroenterology eval if persistent.  Skin cyst  -Upper left back/shoulder.  Possible recurrent sebaceous/epidermal cyst, lower area open and draining at this time.  No sign of cellulitis at this time.   Recheck next 1 week, consider Derm or general surgery eval.  Psoriasis Behind left ear with possible reactive lymphadenopathy on occipital scalp.  Topical treatment discussed with recheck 1 week, RTC precautions  No orders of the defined types were placed in this encounter.  Patient Instructions  Try applying over-the-counter cortisone cream, Aveeno or Eucerin lotion to the rash behind the ear.  I would like to recheck the bumps next visit next week and if still present can refer you to general surgery for possible  skin cyst.  If any worsening during that time, let me know.  We can also follow-up heartburn/chest symptoms further next week.  Continue omeprazole once per day, add Zantac or Pepcid at bedtime, see information below on heartburn and foods to avoid.  Recheck in 1 week.  If any new or worsening chest symptoms prior to that time be seen here, urgent care or emergency room if needed.     Food Choices for Gastroesophageal Reflux Disease, Adult When you have gastroesophageal reflux disease (GERD), the foods you eat and your eating habits are very important. Choosing the right foods can help ease your discomfort. Think about working with a food expert (dietitian) to help you make good choices. What are tips for following this plan? Reading food labels Look for foods that are low in saturated fat. Foods that may help with your symptoms include: Foods that have less than 5% of daily value (DV) of fat. Foods that have 0 grams of trans fat. Cooking Do not fry your food. Cook your food by baking, steaming, grilling, or broiling. These are all methods that do not need a lot of fat for cooking. To add flavor, try to use herbs that are low in spice and acidity. Meal planning  Choose healthy foods that are low in fat, such as: Fruits and vegetables. Whole grains. Low-fat dairy products. Lean meats, fish, and poultry. Eat small meals often instead of eating 3 large meals each day. Eat  your meals slowly in a place where you are relaxed. Avoid bending over or lying down until 2-3 hours after eating. Limit high-fat foods such as fatty meats or fried foods. Limit your intake of fatty foods, such as oils, butter, and shortening. Avoid the following as told by your doctor: Foods that cause symptoms. These may be different for different people. Keep a food diary to keep track of foods that cause symptoms. Alcohol. Drinking a lot of liquid with meals. Eating meals during the 2-3 hours before bed. Lifestyle Stay at a healthy weight. Ask your doctor what weight is healthy for you. If you need to lose weight, work with your doctor to do so safely. Exercise for at least 30 minutes on 5 or more days each week, or as told by your doctor. Wear loose-fitting clothes. Do not smoke or use any products that contain nicotine or tobacco. If you need help quitting, ask your doctor. Sleep with the head of your bed higher than your feet. Use a wedge under the mattress or blocks under the bed frame to raise the head of the bed. Chew sugar-free gum after meals. What foods should eat? Eat a healthy, well-balanced diet of fruits, vegetables, whole grains, low-fat dairy products, lean meats, fish, and poultry. Each person is different. Foods that may cause symptoms in one person may not cause any symptoms in another person. Work with your doctor to find foods that are safe for you. The items listed above may not be a complete list of what you can eat and drink. Contact a food expert for more options. What foods should I avoid? Limiting some of these foods may help in managing the symptoms of GERD. Everyone is different. Talk with a food expert or your doctor to help you find the exact foods to avoid, if any. Fruits Any fruits prepared with added fat. Any fruits that cause symptoms. For some people, this may include citrus fruits, such as oranges, grapefruit, pineapple, and  lemons. Vegetables Deep-fried vegetables. [place] fries. Any  vegetables prepared with added fat. Any vegetables that cause symptoms. For some people, this may include tomatoes and tomato products, chili peppers, onions and garlic, and horseradish. Grains Pastries or quick breads with added fat. Meats and other proteins High-fat meats, such as fatty beef or pork, hot dogs, ribs, ham, sausage, salami, and bacon. Fried meat or protein, including fried fish and fried chicken. Nuts and nut butters, in large amounts. Dairy Whole milk and chocolate milk. Sour cream. Cream. Ice cream. Cream cheese. Milkshakes. Fats and oils Butter. Margarine. Shortening. Ghee. Beverages Coffee and tea, with or without caffeine. Carbonated beverages. Sodas. Energy drinks. Fruit juice made with acidic fruits, such as orange or grapefruit. Tomato juice. Alcoholic drinks. Sweets and desserts Chocolate and cocoa. Donuts. Seasonings and condiments Pepper. Peppermint and spearmint. Added salt. Any condiments, herbs, or seasonings that cause symptoms. For some people, this may include curry, hot sauce, or vinegar-based salad dressings. The items listed above may not be a complete list of what you should not eat and drink. Contact a food expert for more options. Questions to ask your doctor Diet and lifestyle changes are often the first steps that are taken to manage symptoms of GERD. If diet and lifestyle changes do not help, talk with your doctor about taking medicines. Where to find more information International Foundation for Gastrointestinal Disorders: aboutgerd.org Summary When you have GERD, food and lifestyle choices are very important in easing your symptoms. Eat small meals often instead of 3 large meals a day. Eat your meals slowly and in a place where you are relaxed. Avoid bending over or lying down until 2-3 hours after eating. Limit high-fat foods such as fatty meats or fried foods. This information is not  intended to replace advice given to you by your health care provider. Make sure you discuss any questions you have with your health care provider. Document Revised: 04/03/2020 Document Reviewed: 04/03/2020 Elsevier Patient Education  2022 Elsevier Inc.      Signed,   Meredith Staggers, MD [facility], [facility] 07/04/21 2:27 PM

## 2021-07-04 NOTE — Patient Instructions (Addendum)
Try applying over-the-counter cortisone cream, Aveeno or Eucerin lotion to the rash behind the ear.  I would like to recheck the bumps next visit next week and if still present can refer you to general surgery for possible skin cyst.  If any worsening during that time, let me know.  We can also follow-up heartburn/chest symptoms further next week.  Continue omeprazole once per day, add Zantac or Pepcid at bedtime, see information below on heartburn and foods to avoid.  Recheck in 1 week.  If any new or worsening chest symptoms prior to that time be seen here, urgent care or emergency room if needed.     Food Choices for Gastroesophageal Reflux Disease, Adult When you have gastroesophageal reflux disease (GERD), the foods you eat and your eating habits are very important. Choosing the right foods can help ease your discomfort. Think about working with a food expert (dietitian) to help you make good choices. What are tips for following this plan? Reading food labels Look for foods that are low in saturated fat. Foods that may help with your symptoms include: Foods that have less than 5% of daily value (DV) of fat. Foods that have 0 grams of trans fat. Cooking Do not fry your food. Cook your food by baking, steaming, grilling, or broiling. These are all methods that do not need a lot of fat for cooking. To add flavor, try to use herbs that are low in spice and acidity. Meal planning  Choose healthy foods that are low in fat, such as: Fruits and vegetables. Whole grains. Low-fat dairy products. Lean meats, fish, and poultry. Eat small meals often instead of eating 3 large meals each day. Eat your meals slowly in a place where you are relaxed. Avoid bending over or lying down until 2-3 hours after eating. Limit high-fat foods such as fatty meats or fried foods. Limit your intake of fatty foods, such as oils, butter, and shortening. Avoid the following as told by your doctor: Foods that cause  symptoms. These may be different for different people. Keep a food diary to keep track of foods that cause symptoms. Alcohol. Drinking a lot of liquid with meals. Eating meals during the 2-3 hours before bed. Lifestyle Stay at a healthy weight. Ask your doctor what weight is healthy for you. If you need to lose weight, work with your doctor to do so safely. Exercise for at least 30 minutes on 5 or more days each week, or as told by your doctor. Wear loose-fitting clothes. Do not smoke or use any products that contain nicotine or tobacco. If you need help quitting, ask your doctor. Sleep with the head of your bed higher than your feet. Use a wedge under the mattress or blocks under the bed frame to raise the head of the bed. Chew sugar-free gum after meals. What foods should eat? Eat a healthy, well-balanced diet of fruits, vegetables, whole grains, low-fat dairy products, lean meats, fish, and poultry. Each person is different. Foods that may cause symptoms in one person may not cause any symptoms in another person. Work with your doctor to find foods that are safe for you. The items listed above may not be a complete list of what you can eat and drink. Contact a food expert for more options. What foods should I avoid? Limiting some of these foods may help in managing the symptoms of GERD. Everyone is different. Talk with a food expert or your doctor to help you find the exact  foods to avoid, if any. Fruits Any fruits prepared with added fat. Any fruits that cause symptoms. For some people, this may include citrus fruits, such as oranges, grapefruit, pineapple, and lemons. Vegetables Deep-fried vegetables. [place] fries. Any vegetables prepared with added fat. Any vegetables that cause symptoms. For some people, this may include tomatoes and tomato products, chili peppers, onions and garlic, and horseradish. Grains Pastries or quick breads with added fat. Meats and other proteins High-fat  meats, such as fatty beef or pork, hot dogs, ribs, ham, sausage, salami, and bacon. Fried meat or protein, including fried fish and fried chicken. Nuts and nut butters, in large amounts. Dairy Whole milk and chocolate milk. Sour cream. Cream. Ice cream. Cream cheese. Milkshakes. Fats and oils Butter. Margarine. Shortening. Ghee. Beverages Coffee and tea, with or without caffeine. Carbonated beverages. Sodas. Energy drinks. Fruit juice made with acidic fruits, such as orange or grapefruit. Tomato juice. Alcoholic drinks. Sweets and desserts Chocolate and cocoa. Donuts. Seasonings and condiments Pepper. Peppermint and spearmint. Added salt. Any condiments, herbs, or seasonings that cause symptoms. For some people, this may include curry, hot sauce, or vinegar-based salad dressings. The items listed above may not be a complete list of what you should not eat and drink. Contact a food expert for more options. Questions to ask your doctor Diet and lifestyle changes are often the first steps that are taken to manage symptoms of GERD. If diet and lifestyle changes do not help, talk with your doctor about taking medicines. Where to find more information International Foundation for Gastrointestinal Disorders: aboutgerd.org Summary When you have GERD, food and lifestyle choices are very important in easing your symptoms. Eat small meals often instead of 3 large meals a day. Eat your meals slowly and in a place where you are relaxed. Avoid bending over or lying down until 2-3 hours after eating. Limit high-fat foods such as fatty meats or fried foods. This information is not intended to replace advice given to you by your health care provider. Make sure you discuss any questions you have with your health care provider. Document Revised: 04/03/2020 Document Reviewed: 04/03/2020 Elsevier Patient Education  2022 [employer].

## 2021-07-12 ENCOUNTER — Ambulatory Visit: Payer: 59 | Admitting: Family Medicine

## 2021-07-12 ENCOUNTER — Other Ambulatory Visit: Payer: Self-pay

## 2021-07-12 VITALS — BP 128/72 | HR 98 | Temp 98.3°F | Resp 16 | Ht 72.0 in | Wt 290.6 lb

## 2021-07-12 DIAGNOSIS — L409 Psoriasis, unspecified: Secondary | ICD-10-CM

## 2021-07-12 DIAGNOSIS — L729 Follicular cyst of the skin and subcutaneous tissue, unspecified: Secondary | ICD-10-CM | POA: Diagnosis not present

## 2021-07-12 DIAGNOSIS — R0789 Other chest pain: Secondary | ICD-10-CM

## 2021-07-12 DIAGNOSIS — K219 Gastro-esophageal reflux disease without esophagitis: Secondary | ICD-10-CM

## 2021-07-12 NOTE — Progress Notes (Signed)
Opened in error

## 2021-07-12 NOTE — Progress Notes (Signed)
Subjective:  Patient ID: Eric Payne, male    DOB: Nov 15, 1990  Age: 30 y.o. MRN: [id number]  CC:  Chief Complaint  Patient presents with   Heartburn    Pt reports improved since last week, no need for changes at this time    Cyst    Pt noted cyst on his shoulder last visit and ran out of time to discuss would like this done today     HPI Eric Payne presents for   Chest pain Follow-up from September 28 visit.  Atypical pain at that time, improvement with reflux/heartburn triggers, likely GERD/heartburn because.  Also possible component of chest wall pain.  He was continued on PPI, added Zantac or Pepcid, handout given on triggers. Symptoms have improved since last week. Takes pepcid at night, omeprazle during day - pain has resolved.   Cyst on shoulder Noted on his upper left back/shoulder at his September 22's visit.  Thought to have possible recurrent sebaceous epidermal cyst, with a lower area that appears open and draining.  No sign of cellulitis. Still some drainage from lower cyst - daily. Sore last week, not now. Slight redness last week - better. Keeping clean with soap/water/peroxide.   Bumps behind ear have improved with treatment since last visit, cortisone, aveeno.   History Patient Active Problem List   Diagnosis Date Noted   Snoring 12/06/2016   No past medical history on file. No past surgical history on file. No Known Allergies Prior to Admission medications   Medication Sig Start Date End Date Taking? Authorizing Provider  Cetirizine HCl (ZYRTEC PO) Take by mouth as needed.   Yes [provider]  fluticasone (FLONASE) 50 MCG/ACT nasal spray Place into the nose. 12/06/16  Yes [provider]  VENTOLIN HFA 108 (90 Base) MCG/ACT inhaler INHALE 2 PUFFS INTO THE LUNGS EVERY 4 HOURS AS NEEDED (COUGH, SHORTNESS OF BREATH OR WHEEZING.). 09/18/17  Yes Barnett Abu, [place] D, PA-C  doxycycline (VIBRA-TABS) 100 MG tablet Take 1 tablet (100 mg total)  by mouth 2 (two) times daily. Patient not taking: No sig reported 06/30/18   Shade Flood, MD  ranitidine (ZANTAC) 150 MG tablet Take 1 tablet (150 mg total) by mouth 2 (two) times daily. Patient not taking: No sig reported 02/03/18   Valarie Cones, Dema Severin, PA-C  sucralfate (CARAFATE) 1 GM/10ML suspension Take 10 mLs (1 g total) by mouth 4 (four) times daily -  with meals and at bedtime. Patient not taking: No sig reported 02/03/18   Valarie Cones, Dema Severin, PA-C   Social History   Socioeconomic History   Marital status: Single    Spouse name: Not on file   Number of children: Not on file   Years of education: Not on file   Highest education level: Not on file  Occupational History   Not on file  Tobacco Use   Smoking status: Former    Years: 1.00    Types: Cigars, Cigarettes   Smokeless tobacco: Current    Types: Chew   Tobacco comments:    chewing for 6 years  Substance and Sexual Activity   Alcohol use: Yes    Alcohol/week: 0.0 standard drinks    Comment: beer   Drug use: Not on file   Sexual activity: Not on file  Other Topics Concern   Not on file  Social History Narrative   Not on file   Social Determinants of Health   Financial Resource Strain: Not on file  Food Insecurity:  Not on file  Transportation Needs: Not on file  Physical Activity: Not on file  Stress: Not on file  Social Connections: Not on file  Intimate Partner Violence: Not on file    Review of Systems  Per HPI.  Objective:   Vitals:   07/12/21 1139  BP: 128/72  Pulse: 98  Resp: 16  Temp: 98.3 F (36.8 C)  TempSrc: Temporal  SpO2: 96%  Weight: 290 lb 9.6 oz (131.8 kg)  Height: 6' (1.829 m)     Physical Exam Vitals reviewed.  Constitutional:      Appearance: He is well-developed.  HENT:     Head: Normocephalic and atraumatic.  Neck:     Vascular: No carotid bruit or JVD.  Cardiovascular:     Rate and Rhythm: Normal rate and regular rhythm.     Heart sounds: Normal heart sounds. No murmur  heard. Pulmonary:     Effort: Pulmonary effort is normal.     Breath sounds: Normal breath sounds. No rales.  Abdominal:     General: Abdomen is flat.     Palpations: Abdomen is soft.     Tenderness: There is no abdominal tenderness.  Musculoskeletal:     Right lower leg: No edema.     Left lower leg: No edema.  Skin:    General: Skin is warm and dry.     Comments: Healing wound on left upper back, no significant surrounding induration or discharge, small firm area superior medial without tenderness, erythema or fluctuance.  Left posterior scalp and behind left ear with scaling, underlying erythema patch.  Neurological:     Mental Status: He is alert and oriented to person, place, and time.  Psychiatric:        Mood and Affect: Mood normal.        Assessment & Plan:  Eric Payne is a 30 y.o. male . Skin cyst - Plan: Ambulatory referral to Dermatology, Ambulatory referral to Dermatology  -Improving, no sign of infection at this time, deferred antibiotics.  Possible previous sebaceous versus epidermal cyst, likely similar area superior medial.  Refer to dermatology.  RTC precautions if worsening symptoms or new drainage/discharge.  Psoriasis of scalp - Plan: Ambulatory referral to Dermatology, Ambulatory referral to Dermatology  -Continue topical treatment with cortisone, hydrating lotion, refer to dermatology to evaluate for other treatment options.  Gastroesophageal reflux disease, unspecified whether esophagitis present Atypical chest pain  -Improved.  Continue PPI, H2 blocker, option to taper to just one or the other in the next few weeks if doing well.  If he continues to require dual therapy would recommend gastroenterology eval.  RTC precautions.  No orders of the defined types were placed in this encounter.  Patient Instructions  Glad to hear chest symptoms have improved. Ok to continue omeprazole and pepcid for now, then decrease to one or the other in the next few  weeks. If you continue to require both meds in the next 6 weeks, let me know and I can refer you to gastroenterology.   Ok to continue steroid cream, aveeno or eucerin to rash on scalp but I will refer you to dermatologist. Can discuss back cyst there as well. Return to the clinic or go to the nearest emergency room if any of your symptoms worsen or new symptoms occur.       Signed,   Meredith Staggers, MD [facility], [facility] 07/12/21 12:29 PM

## 2021-07-12 NOTE — Patient Instructions (Addendum)
Glad to hear chest symptoms have improved. Ok to continue omeprazole and pepcid for now, then decrease to one or the other in the next few weeks. If you continue to require both meds in the next 6 weeks, let me know and I can refer you to gastroenterology.   Ok to continue steroid cream, aveeno or eucerin to rash on scalp but I will refer you to dermatologist. Can discuss back cyst there as well. Return to the clinic or go to the nearest emergency room if any of your symptoms worsen or new symptoms occur.

## 2021-09-10 ENCOUNTER — Ambulatory Visit (INDEPENDENT_AMBULATORY_CARE_PROVIDER_SITE_OTHER): Payer: 59 | Admitting: Family Medicine

## 2021-09-10 VITALS — BP 136/72 | HR 89 | Temp 98.3°F | Resp 18 | Ht 72.0 in | Wt 290.4 lb

## 2021-09-10 DIAGNOSIS — Z1329 Encounter for screening for other suspected endocrine disorder: Secondary | ICD-10-CM | POA: Diagnosis not present

## 2021-09-10 DIAGNOSIS — K219 Gastro-esophageal reflux disease without esophagitis: Secondary | ICD-10-CM

## 2021-09-10 DIAGNOSIS — Z13220 Encounter for screening for lipoid disorders: Secondary | ICD-10-CM | POA: Diagnosis not present

## 2021-09-10 DIAGNOSIS — E669 Obesity, unspecified: Secondary | ICD-10-CM | POA: Diagnosis not present

## 2021-09-10 DIAGNOSIS — Z6839 Body mass index (BMI) 39.0-39.9, adult: Secondary | ICD-10-CM

## 2021-09-10 DIAGNOSIS — Z0001 Encounter for general adult medical examination with abnormal findings: Secondary | ICD-10-CM | POA: Diagnosis not present

## 2021-09-10 DIAGNOSIS — Z130 Encounter for screening for diseases of the blood and blood-forming organs and certain disorders involving the immune mechanism: Secondary | ICD-10-CM | POA: Diagnosis not present

## 2021-09-10 DIAGNOSIS — M25561 Pain in right knee: Secondary | ICD-10-CM | POA: Diagnosis not present

## 2021-09-10 DIAGNOSIS — Z0000 Encounter for general adult medical examination without abnormal findings: Secondary | ICD-10-CM | POA: Diagnosis not present

## 2021-09-10 DIAGNOSIS — Z131 Encounter for screening for diabetes mellitus: Secondary | ICD-10-CM | POA: Diagnosis not present

## 2021-09-10 NOTE — Patient Instructions (Addendum)
Try to meal plan to minimize need for fast food, and continue to work on decreasing sugar containing beverages.  I will check some screening labs today.  Please have x-ray of your right knee performed at [facility] location.  Depending on x-ray results I can refer you to orthopedics to decide on next step or other imaging.  Let me know if there are questions.  Acute Knee Pain, Adult Acute knee pain is sudden and may be caused by damage, swelling, or irritation of the muscles and tissues that support the knee. Pain may result from: A fall. An injury to the knee from twisting motions. A hit to the knee. Infection. Acute knee pain may go away on its own with time and rest. If it does not, your health care provider may order tests to find the cause of the pain. These may include: Imaging tests, such as an X-ray, MRI, CT scan, or ultrasound. Joint aspiration. In this test, fluid is removed from the knee and evaluated. Arthroscopy. In this test, a lighted tube is inserted into the knee and an image is projected onto a TV screen. Biopsy. In this test, a sample of tissue is removed from the body and studied under a microscope. Follow these instructions at home: If you have a knee sleeve or brace:  Wear the knee sleeve or brace as told by your health care provider. Remove it only as told by your health care provider. Loosen it if your toes tingle, become numb, or turn cold and blue. Keep it clean. If the knee sleeve or brace is not waterproof: Do not let it get wet. Cover it with a watertight covering when you take a bath or shower. Activity Rest your knee. Do not do things that cause pain or make pain worse. Avoid high-impact activities or exercises, such as running, jumping rope, or doing jumping jacks. Work with a physical therapist to make a safe exercise program, as recommended by your health care provider. Do exercises as told by your physical therapist. Managing pain, stiffness, and  swelling  If directed, put ice on the affected knee. To do this: If you have a removable knee sleeve or brace, remove it as told by your health care provider. Put ice in a plastic bag. Place a towel between your skin and the bag. Leave the ice on for 20 minutes, 2-3 times a day. Remove the ice if your skin turns bright red. This is very important. If you cannot feel pain, heat, or cold, you have a greater risk of damage to the area. If directed, use an elastic bandage to put pressure (compression) on your injured knee. This may control swelling, give support, and help with discomfort. Raise (elevate) your knee above the level of your heart while you are sitting or lying down. Sleep with a pillow under your knee. General instructions Take over-the-counter and prescription medicines only as told by your health care provider. Do not use any products that contain nicotine or tobacco, such as cigarettes, e-cigarettes, and chewing tobacco. If you need help quitting, ask your health care provider. If you are overweight, work with your health care provider and a dietitian to set a weight-loss goal that is healthy and reasonable for you. Extra weight can put pressure on your knee. Pay attention to any changes in your symptoms. Keep all follow-up visits. This is important. Contact a health care provider if: Your knee pain continues, changes, or gets worse. You have a fever along with knee  pain. Your knee feels warm to the touch or is red. Your knee buckles or locks up. Get help right away if: Your knee swells, and the swelling becomes worse. You cannot move your knee. You have severe pain in your knee that cannot be managed with pain medicine. Summary Acute knee pain can be caused by a fall, an injury, an infection, or damage, swelling, or irritation of the tissues that support your knee. Your health care provider may perform tests to find out the cause of the pain. Pay attention to any changes in  your symptoms. Relieve your pain with rest, medicines, light activity, and the use of ice. Get help right away if your knee swells, you cannot move your knee, or you have severe pain that cannot be managed with medicine. This information is not intended to replace advice given to you by your health care provider. Make sure you discuss any questions you have with your health care provider. Document Revised: 03/08/2020 Document Reviewed: 03/08/2020 Elsevier Patient Education  2022 [employer].
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
## 2021-09-11 LAB — HEMOGLOBIN A1C: Hgb A1c MFr Bld: 5.2 % (ref 4.6–6.5)

## 2021-09-11 LAB — COMPREHENSIVE METABOLIC PANEL
ALT: 22 U/L (ref 0–53)
AST: 18 U/L (ref 0–37)
Albumin: 4.8 g/dL (ref 3.5–5.2)
Alkaline Phosphatase: 81 U/L (ref 39–117)
BUN: 12 mg/dL (ref 6–23)
CO2: 28 mEq/L (ref 19–32)
Calcium: 9.8 mg/dL (ref 8.4–10.5)
Chloride: 100 mEq/L (ref 96–112)
Creatinine, Ser: 0.91 mg/dL (ref 0.40–1.50)
GFR: 114.78 mL/min (ref >60.00)
Glucose, Bld: 81 mg/dL (ref 70–99)
Potassium: 4 mEq/L (ref 3.5–5.1)
Sodium: 140 mEq/L (ref 135–145)
Total Bilirubin: 0.9 mg/dL (ref 0.2–1.2)
Total Protein: 7.3 g/dL (ref 6.0–8.3)

## 2021-09-11 LAB — CBC WITH DIFFERENTIAL/PLATELET
Basophils Absolute: 0.1 10*3/uL (ref 0.0–0.1)
Basophils Relative: 0.6 % (ref 0.0–3.0)
Eosinophils Absolute: 0.3 10*3/uL (ref 0.0–0.7)
Eosinophils Relative: 3.4 % (ref 0.0–5.0)
HCT: 47.5 % (ref 39.0–52.0)
Hemoglobin: 16.4 g/dL (ref 13.0–17.0)
Lymphocytes Relative: 31.9 % (ref 12.0–46.0)
Lymphs Abs: 3.1 10*3/uL (ref 0.7–4.0)
MCHC: 34.6 g/dL (ref 30.0–36.0)
MCV: 87.7 fl (ref 78.0–100.0)
Monocytes Absolute: 0.7 10*3/uL (ref 0.1–1.0)
Monocytes Relative: 7.6 % (ref 3.0–12.0)
Neutro Abs: 5.5 10*3/uL (ref 1.4–7.7)
Neutrophils Relative %: 56.5 % (ref 43.0–77.0)
Platelets: 255 10*3/uL (ref 150.0–400.0)
RBC: 5.41 Mil/uL (ref 4.22–5.81)
RDW: 11.8 % (ref 11.5–15.5)
WBC: 9.7 10*3/uL (ref 4.0–10.5)

## 2021-09-11 LAB — LIPID PANEL
Cholesterol: 220 mg/dL — ABNORMAL HIGH (ref 0–200)
HDL: 41.4 mg/dL (ref >39.00)
NonHDL: 178.99
Total CHOL/HDL Ratio: 5
Triglycerides: 213 mg/dL — ABNORMAL HIGH (ref 0.0–149.0)
VLDL: 42.6 mg/dL — ABNORMAL HIGH (ref 0.0–40.0)

## 2021-09-11 LAB — LDL CHOLESTEROL, DIRECT: Direct LDL: 168 mg/dL

## 2021-09-11 LAB — TSH: TSH: 0.97 u[IU]/mL (ref 0.35–5.50)

## 2022-03-19 ENCOUNTER — Other Ambulatory Visit (HOSPITAL_COMMUNITY): Payer: Self-pay

## 2022-03-20 ENCOUNTER — Ambulatory Visit (INDEPENDENT_AMBULATORY_CARE_PROVIDER_SITE_OTHER): Payer: 59 | Admitting: Family Medicine

## 2022-03-20 ENCOUNTER — Encounter: Payer: Self-pay | Admitting: Family Medicine

## 2022-03-20 DIAGNOSIS — J9801 Acute bronchospasm: Secondary | ICD-10-CM | POA: Diagnosis not present

## 2022-03-20 MED ORDER — ALBUTEROL SULFATE HFA 108 (90 BASE) MCG/ACT IN AERS: INHALATION_SPRAY | RESPIRATORY_TRACT | 1 refills | Status: DC

## 2022-03-20 MED ORDER — PULMICORT FLEXHALER 90 MCG/ACT IN AEPB: 1.0000 | INHALATION_SPRAY | Freq: Two times a day (BID) | RESPIRATORY_TRACT | 3 refills | Status: DC

## 2022-03-20 NOTE — Patient Instructions (Addendum)
Thanks for coming in today.  Okay to use albuterol if only infrequent use.  You might want to try the steroid inhaler 1 to 2 puffs twice per day if you are going to be exposed to smoke, or temperature changes that typically flare of the asthma/wheezing.  Let me know how that works.  Follow-up in 6 months for physical, but let me know if there are questions sooner.    If you have lab work done today you will be contacted with your lab results within the next 2 weeks.  If you have not heard from [place] then please contact us. The fastest way to get your results is to register for My Chart.   IF you received an x-ray today, you will receive an invoice from [facility]. Please contact [facility] at [phone] with questions or concerns regarding your invoice.   IF you received labwork today, you will receive an invoice from [place]. Please contact [facility] at [phone] with questions or concerns regarding your invoice.   Our billing staff will not be able to assist you with questions regarding bills from these companies.  You will be contacted with the lab results as soon as they are available. The fastest way to get your results is to activate your My Chart account. Instructions are located on the last page of this paperwork. If you have not heard from [place] regarding the results in 2 weeks, please contact this office.

## 2022-03-20 NOTE — Progress Notes (Signed)
Subjective:  Patient ID: Eric Payne, male    DOB: [birth year]  Age: 31 y.o. MRN: [id number]  CC:  Chief Complaint  Patient presents with   Medication Refill    Patient states he is here for medication refill on inhaler.    HPI OLAND MALANGA presents for   Albuterol inhaler/wheezing.  Bronchitis in 2017. Has used since then if needed. Able to go months without use, then some days few times per day. No nighttime symptoms. Weather changes typically cause flare. Burning wood, smoke may trigger. Works well. No recent reflux issues.   History Patient Active Problem List   Diagnosis Date Noted   Snoring 12/06/2016   No past medical history on file. No past surgical history on file. No Known Allergies Prior to Admission medications   Medication Sig Start Date End Date Taking? Authorizing Provider  fluticasone (FLONASE) 50 MCG/ACT nasal spray Place into the nose. 12/06/16  Yes [provider]  VENTOLIN HFA 108 (90 Base) MCG/ACT inhaler INHALE 2 PUFFS INTO THE LUNGS EVERY 4 HOURS AS NEEDED (COUGH, SHORTNESS OF BREATH OR WHEEZING.). 09/18/17  Yes Magdalene River, PA-C   Social History   Socioeconomic History   Marital status: Single    Spouse name: Not on file   Number of children: Not on file   Years of education: Not on file   Highest education level: Not on file  Occupational History   Not on file  Tobacco Use   Smoking status: Former    Years: 1.00    Types: Cigars, Cigarettes   Smokeless tobacco: Current    Types: Chew   Tobacco comments:    chewing for 6 years  Substance and Sexual Activity   Alcohol use: Yes    Alcohol/week: 0.0 standard drinks of alcohol    Comment: beer   Drug use: Not on file   Sexual activity: Not on file  Other Topics Concern   Not on file  Social History Narrative   Not on file   Social Determinants of Health   Financial Resource Strain: Not on file  Food Insecurity: Not on file  Transportation Needs: Not on file   Physical Activity: Not on file  Stress: Not on file  Social Connections: Not on file  Intimate Partner Violence: Not on file    Review of Systems Per HPI, denies recent reflux/heartburn.  Objective:   Vitals:   03/20/22 1137  BP: 116/78  Pulse: 88  Resp: 18  Temp: 98.2 F (36.8 C)  TempSrc: Temporal  SpO2: 97%  Weight: 291 lb 3.2 oz (132.1 kg)  Height: 6' (1.829 m)     Physical Exam Vitals reviewed.  Constitutional:      Appearance: He is well-developed.  HENT:     Head: Normocephalic and atraumatic.  Neck:     Vascular: No carotid bruit or JVD.  Cardiovascular:     Rate and Rhythm: Normal rate and regular rhythm.     Heart sounds: Normal heart sounds. No murmur heard. Pulmonary:     Effort: Pulmonary effort is normal. No respiratory distress.     Breath sounds: Normal breath sounds. No stridor. No wheezing, rhonchi or rales.  Musculoskeletal:     Right lower leg: No edema.     Left lower leg: No edema.  Skin:    General: Skin is warm and dry.  Neurological:     Mental Status: He is alert and oriented to person, place, and time.  Psychiatric:  Mood and Affect: Mood normal.        Assessment & Plan:  Eric Payne is a 31 y.o. male . Bronchospasm - Plan: albuterol (VENTOLIN HFA) 108 (90 Base) MCG/ACT inhaler, Budesonide (PULMICORT FLEXHALER) 90 MCG/ACT inhaler  -Intermittent need for albuterol.  Suspect mild intermittent asthma.  Less likely upper airway cough syndrome as no recent reflux and quick resolution with albuterol.  Overall well controlled with infrequent need for albuterol, but certain triggers cause increased use, specifically smoke and weather changes.  Option of short-term inhaled corticosteroid during those flares with albuterol as needed for breakthrough symptoms.  Potential side effects discussed, rinse mouth after use.  RTC precautions.  Meds ordered this encounter  Medications   albuterol (VENTOLIN HFA) 108 (90 Base) MCG/ACT  inhaler    Sig: INHALE 2 PUFFS INTO THE LUNGS EVERY 4 HOURS AS NEEDED (COUGH, SHORTNESS OF BREATH OR WHEEZING.).    Dispense:  18 g    Refill:  1   Budesonide (PULMICORT FLEXHALER) 90 MCG/ACT inhaler    Sig: Inhale 1-2 puffs into the lungs 2 (two) times daily. If flair of wheezing.    Dispense:  1 each    Refill:  3   Patient Instructions  Thanks for coming in today.  Okay to use albuterol if only infrequent use.  You might want to try the steroid inhaler 1 to 2 puffs twice per day if you are going to be exposed to smoke, or temperature changes that typically flare of the asthma/wheezing.  Let me know how that works.  Follow-up in 6 months for physical, but let me know if there are questions sooner.    If you have lab work done today you will be contacted with your lab results within the next 2 weeks.  If you have not heard from [place] then please contact us. The fastest way to get your results is to register for My Chart.   IF you received an x-ray today, you will receive an invoice from [facility]. Please contact [facility] at [phone] with questions or concerns regarding your invoice.   IF you received labwork today, you will receive an invoice from [place]. Please contact [facility] at [phone] with questions or concerns regarding your invoice.   Our billing staff will not be able to assist you with questions regarding bills from these companies.  You will be contacted with the lab results as soon as they are available. The fastest way to get your results is to activate your My Chart account. Instructions are located on the last page of this paperwork. If you have not heard from [place] regarding the results in 2 weeks, please contact this office.        Signed,   Merri Ray, MD [place], [facility] 03/20/22 5:11 PM

## 2022-09-11 ENCOUNTER — Encounter: Payer: 59 | Admitting: Family Medicine

## 2023-03-19 ENCOUNTER — Ambulatory Visit (INDEPENDENT_AMBULATORY_CARE_PROVIDER_SITE_OTHER): Payer: 59 | Admitting: Family Medicine

## 2023-03-19 ENCOUNTER — Encounter: Payer: Self-pay | Admitting: Family Medicine

## 2023-03-19 VITALS — BP 128/78 | HR 89 | Temp 97.6°F | Ht 72.0 in | Wt 276.4 lb

## 2023-03-19 DIAGNOSIS — R0683 Snoring: Secondary | ICD-10-CM | POA: Diagnosis not present

## 2023-03-19 DIAGNOSIS — J9801 Acute bronchospasm: Secondary | ICD-10-CM | POA: Diagnosis not present

## 2023-03-19 DIAGNOSIS — R059 Cough, unspecified: Secondary | ICD-10-CM

## 2023-03-19 DIAGNOSIS — J22 Unspecified acute lower respiratory infection: Secondary | ICD-10-CM

## 2023-03-19 MED ORDER — ALBUTEROL SULFATE HFA 108 (90 BASE) MCG/ACT IN AERS: INHALATION_SPRAY | RESPIRATORY_TRACT | 1 refills | Status: DC

## 2023-03-19 MED ORDER — AZITHROMYCIN 250 MG PO TABS
ORAL_TABLET | ORAL | 0 refills | Status: AC
Start: 2023-03-19 — End: 2023-03-24

## 2023-03-19 MED ORDER — FLUTICASONE-SALMETEROL 250-50 MCG/ACT IN AEPB: 1.0000 | INHALATION_SPRAY | Freq: Two times a day (BID) | RESPIRATORY_TRACT | 1 refills | Status: AC

## 2023-03-19 NOTE — Progress Notes (Signed)
Subjective:  Patient ID: Eric Payne, male    DOB: 1991/07/17  Age: 32 y.o. MRN: [id number]  CC:  Chief Complaint  Patient presents with   Breathing Problem    Notes had bronchitis about ~8 years ago and started having some difficulty with breathing, April went on a work trip and got a sinus illness and notes a lot of wheezing and snoring since then plus increase in inhaler use. Congested in the mornings as well     HPI Eric Payne presents for   Wheezing, bronchospasm, As above, recent work trip in April, upper respiratory illness with increased wheezing, snoring since then, increased use of albuterol.  Some chest  congestion in the morning is worse. Discolored phlegm since April, no fever.  No fever. Intial dayquil/nyquil. Overall better except chest congestion, and wheezing. Daily albuterol use now past month and a half - sometimes few inhalations per day.Marland Kitchen Has not tried pulmicort recently. Flonase daily - no discolored nasal d/c. Living with GF past month - more animals and dust.   Bronchospasm discussed last June, Pulmicort Flexhaler prescribed for mild intermittent asthma versus less likely upper airway cough syndrome. Tried pulmicort last year for about a month - not sure it was helpful, but rare wheezing then. Albuterol once per week was more helpful.   Snoring for years, GF has noticed that he has pauses at times in addition to snoring. Some fatigue in mornings. Some caffeine during day. Would like to check into sleep apnea.         History Patient Active Problem List   Diagnosis Date Noted   Snoring 12/06/2016   No past medical history on file. No past surgical history on file. No Known Allergies Prior to Admission medications   Medication Sig Start Date End Date Taking? Authorizing Provider  albuterol (VENTOLIN HFA) 108 (90 Base) MCG/ACT inhaler INHALE 2 PUFFS INTO THE LUNGS EVERY 4 HOURS AS NEEDED (COUGH, SHORTNESS OF BREATH OR WHEEZING.). 03/20/22  Yes Shade Flood, MD  Budesonide (PULMICORT FLEXHALER) 90 MCG/ACT inhaler Inhale 1-2 puffs into the lungs 2 (two) times daily. If flair of wheezing. 03/20/22  Yes Shade Flood, MD  fluticasone [facility]) 50 MCG/ACT nasal spray Place into the nose. 12/06/16  Yes [provider]   Social History   Socioeconomic History   Marital status: Single    Spouse name: Not on file   Number of children: Not on file   Years of education: Not on file   Highest education level: Not on file  Occupational History   Not on file  Tobacco Use   Smoking status: Former    Years: 1    Types: Cigars, Cigarettes   Smokeless tobacco: Current    Types: Chew   Tobacco comments:    chewing for 6 years  Substance and Sexual Activity   Alcohol use: Yes    Alcohol/week: 0.0 standard drinks of alcohol    Comment: beer   Drug use: Not on file   Sexual activity: Not on file  Other Topics Concern   Not on file  Social History Narrative   Not on file   Social Determinants of Health   Financial Resource Strain: Patient Declined (03/19/2023)   Overall Financial Resource Strain (CARDIA)    Difficulty of Paying Living Expenses: Patient declined  Food Insecurity: Patient Declined (03/19/2023)   Hunger Vital Sign    Worried About Running Out of Food in the Last Year: Patient declined  Ran Out of Food in the Last Year: Patient declined  Transportation Needs: Patient Declined (03/19/2023)   PRAPARE - Administrator, Civil Service (Medical): Patient declined    Lack of Transportation (Non-Medical): Patient declined  Physical Activity: Unknown (03/19/2023)   Exercise Vital Sign    Days of Exercise per Week: Patient declined    Minutes of Exercise per Session: Not on file  Stress: Stress Concern Present (03/19/2023)   [employer] of Occupational Health - Occupational Stress Questionnaire    Feeling of Stress : To some extent  Social Connections: Unknown (03/19/2023)   Social Connection and  Isolation Panel [NHANES]    Frequency of Communication with Friends and Family: Patient declined    Frequency of Social Gatherings with Friends and Family: Patient declined    Attends Religious Services: Patient declined    Database administrator or Organizations: Patient declined    Attends Banker Meetings: Not on file    Marital Status: Patient declined  Intimate Partner Violence: Not on file    Review of Systems Per HPI  Objective:   Vitals:   03/19/23 0858  BP: 128/78  Pulse: 89  Temp: 97.6 F (36.4 C)  TempSrc: Temporal  SpO2: 98%  Weight: 276 lb 6.4 oz (125.4 kg)  Height: 6' (1.829 m)     Physical Exam Vitals reviewed.  Constitutional:      Appearance: He is well-developed.  HENT:     Head: Normocephalic and atraumatic.  Neck:     Vascular: No carotid bruit or JVD.  Cardiovascular:     Rate and Rhythm: Normal rate and regular rhythm.     Heart sounds: Normal heart sounds. No murmur heard. Pulmonary:     Effort: Pulmonary effort is normal.     Breath sounds: Normal breath sounds. No rales.  Musculoskeletal:     Right lower leg: No edema.     Left lower leg: No edema.  Skin:    General: Skin is warm and dry.  Neurological:     Mental Status: He is alert and oriented to person, place, and time.  Psychiatric:        Mood and Affect: Mood normal.        Assessment & Plan:  Eric Payne is a 32 y.o. male . Cough, unspecified type Bronchospasm - Plan: albuterol (VENTOLIN HFA) 108 (90 Base) MCG/ACT inhaler LRTI (lower respiratory tract infection) - Plan: azithromycin (ZITHROMAX) 250 MG tablet  -Persistent discolored phlegm, possible bronchitis versus other lower respiratory tract infection.  Start azithromycin with RTC precautions.  Lungs overall clear but distant breath sounds on exam.  Hold on imaging for now unless not improving or worsening.  -Suspected mild intermittent asthma, increased symptoms after recent infection and with  possible environmental triggers with new living situation.  Start Advair, continue Flonase, albuterol if needed, recheck 1 month.  Snoring - Plan: Ambulatory referral to Sleep Studies  -Suspicious for obstructive sleep apnea, refer to sleep specialist.  Meds ordered this encounter  Medications   azithromycin (ZITHROMAX) 250 MG tablet    Sig: Take 2 tablets on day 1, then 1 tablet daily on days 2 through 5    Dispense:  6 tablet    Refill:  0   albuterol (VENTOLIN HFA) 108 (90 Base) MCG/ACT inhaler    Sig: INHALE 2 PUFFS INTO THE LUNGS EVERY 4 HOURS AS NEEDED (COUGH, SHORTNESS OF BREATH OR WHEEZING.).    Dispense:  18 g  Refill:  1   fluticasone-salmeterol (ADVAIR) 250-50 MCG/ACT AEPB    Sig: Inhale 1 puff into the lungs in the morning and at bedtime.    Dispense:  60 each    Refill:  1   Patient Instructions  Thanks for coming in today.  Try Advair which is similar to the Pulmicort but has another medicine to help with asthma.  Albuterol can be used if needed, but this new medicine should be more effective and lessen the need for albuterol.  Recheck in 1 month.  Azithromycin for possible bronchitis, if any fever, worsening cough, shortness of breath or other worsening symptoms be seen as x-ray may be needed.  I will refer you to sleep specialist for possible sleep apnea testing.  Return to the clinic or go to the nearest emergency room if any of your symptoms worsen or new symptoms occur.     Signed,   Meredith Staggers, MD [facility], [facility] 03/19/23 9:38 AM

## 2023-03-19 NOTE — Patient Instructions (Signed)
Thanks for coming in today.  Try Advair which is similar to the Pulmicort but has another medicine to help with asthma.  Albuterol can be used if needed, but this new medicine should be more effective and lessen the need for albuterol.  Recheck in 1 month.  Azithromycin for possible bronchitis, if any fever, worsening cough, shortness of breath or other worsening symptoms be seen as x-ray may be needed.  I will refer you to sleep specialist for possible sleep apnea testing.  Return to the clinic or go to the nearest emergency room if any of your symptoms worsen or new symptoms occur.

## 2023-04-22 ENCOUNTER — Ambulatory Visit (INDEPENDENT_AMBULATORY_CARE_PROVIDER_SITE_OTHER): Payer: 59 | Admitting: Neurology

## 2023-04-22 ENCOUNTER — Encounter: Payer: Self-pay | Admitting: Neurology

## 2023-04-22 VITALS — BP 134/85 | HR 82 | Ht 72.0 in | Wt 285.0 lb

## 2023-04-22 DIAGNOSIS — Z9189 Other specified personal risk factors, not elsewhere classified: Secondary | ICD-10-CM | POA: Diagnosis not present

## 2023-04-22 DIAGNOSIS — E669 Obesity, unspecified: Secondary | ICD-10-CM

## 2023-04-22 DIAGNOSIS — R0683 Snoring: Secondary | ICD-10-CM | POA: Diagnosis not present

## 2023-04-22 DIAGNOSIS — K219 Gastro-esophageal reflux disease without esophagitis: Secondary | ICD-10-CM | POA: Diagnosis not present

## 2023-04-22 DIAGNOSIS — R0681 Apnea, not elsewhere classified: Secondary | ICD-10-CM

## 2023-04-22 NOTE — Progress Notes (Signed)
SLEEP MEDICINE CLINIC    Provider:  Melvyn Novas, MD  Primary Care Physician:  Shade Flood, MD [address] [place] [address]     Referring Provider: Shade Flood, Md [address] [place],  [place] [address]          Chief Complaint according to patient   Patient presents with:     New Patient (Initial Visit)     Loud snoring bothers his male partner, dating for about 6 months. No long term observation. His parents remember him snoring as a teen.  During a holiday on [place] he was not snoring but shortly after he had an UR infection, needed steroids and ATB.       HISTORY OF PRESENT ILLNESS:  Eric Payne is a 32 y.o. male NEW patient who is seen upon referral on 04/22/2023 from Dr Neva Seat for a sleep consultation. .  Chief concern according to patient : " he feels not daytime sleepy, wakes not refreshed, never has been " a morning person"  he may sleep through multiple alarms".    I have the pleasure of seeing Eric Payne 04/22/23 a right-handed male with a possible sleep disorder. The patient had no previous  sleep study.    Sleep relevant medical history: no nocturia, no Sleep walking but sleep talking. GERD. Sore throat, heavy set.  Childhood asthma.   Tonsillectomy No ENT surgery, injury, no cervical spine surgery. Obesity.    Family medical /sleep history: Father was on CPAP with OSA, passed away of unrelated causes.    Social history:  Patient is working for [employer]. he lives in a household with GF,   6 dogs and 2 cats, one 24 year old step son- he [place] farm- with many animals. Horses, goats , chickens, 22 acres.  The patient currently works  days- used to work in shifts( Chief Technology Officer,) Pets are  present. Tobacco use: none , chew tobacco until last years- .   ETOH use :Beer  2/ day,  Caffeine intake in form of Coffee( /) Soda( dr pepper/ mountain dew 2 a day) Tea ( /) or energy drinks Exercise in form of farm  work.   Hobbies : farming.     Sleep habits are as follows: The patient's dinner time is between  PM.  The patient goes to bed at 5-7 PM and continues to sleep for 11-12 hours, wakes for none or one bathroom break.  His bedroom is cool, quiet and dark.  The preferred sleep position is prone, with the support of 1-2 pillows.  Dreams are reportedly frequent/vivid. The patient wakes up with difficulties, inertia, needs several alarms. 6.30 AM is the usual rise time. He reports not feeling refreshed or restored in AM, with symptoms such as dry mouth, morning headaches, and residual fatigue.  Sore throat.  Naps are taken infrequently,  a question of opportunity- lasting from 20 to 30 minutes and are  refreshing.    Review of Systems: Out of a complete 14 system review, the patient complains of only the following symptoms, and all other reviewed systems are negative.:  Fatigue, sleepiness , snoring, fragmented sleep,   Rare insomnia, falling asleep is difficult when un der stress. How likely are you to doze in the following situations: 0 = not likely, 1 = slight chance, 2 = moderate chance, 3 = high chance   Sitting and Reading? Watching Television?  Sitting inactive in a public place (theater or meeting)? As a passenger in a car for an hour without a break? Lying down in the afternoon when circumstances permit? Sitting and talking to someone? Sitting quietly after lunch without alcohol? In a car, while stopped for a few minutes in traffic?   Total = 7/ 24 points   FSS endorsed at 29/ 63 points.   Ventolin , Flonase,  Advair prn.   Social History   Socioeconomic History   Marital status: Single    Spouse name: Not on file   Number of children: Not on file   Years of education: Not on file   Highest education level: Not on file  Occupational History   Not on file  Tobacco Use   Smoking status: Former    Types: Cigars, Cigarettes   Smokeless tobacco: Current    Types: Chew    Tobacco comments:    chewing for 6 years  Substance and Sexual Activity   Alcohol use: Yes    Alcohol/week: 0.0 standard drinks of alcohol    Comment: beer   Drug use: Not on file   Sexual activity: Not on file  Other Topics Concern   Not on file  Social History Narrative   Not on file   Social Determinants of Health   Financial Resource Strain: Patient Declined (03/19/2023)   Overall Financial Resource Strain (CARDIA)    Difficulty of Paying Living Expenses: Patient declined  Food Insecurity: Patient Declined (03/19/2023)   Hunger Vital Sign    Worried About Running Out of Food in the Last Year: Patient declined    Ran Out of Food in the Last Year: Patient declined  Transportation Needs: Patient Declined (03/19/2023)   PRAPARE - Administrator, Civil Service (Medical): Patient declined    Lack of Transportation (Non-Medical): Patient declined  Physical Activity: Unknown (03/19/2023)   Exercise Vital Sign    Days of Exercise per Week: Patient declined    Minutes of Exercise per Session: Not on file  Stress: Stress Concern Present (03/19/2023)   [employer] of Occupational Health - Occupational Stress Questionnaire    Feeling of Stress : To some extent  Social Connections: Unknown (03/19/2023)   Social Connection and Isolation Panel [NHANES]    Frequency of Communication with Friends and Family: Patient declined    Frequency of Social Gatherings with Friends and Family: Patient declined    Attends Religious Services: Patient declined    Database administrator or Organizations: Patient declined    Attends Banker Meetings: Not on file    Marital Status: Patient declined    History reviewed. No pertinent family history. Father had OSA, no DM , NO CAD.  Cancer on both sides.   History reviewed. No pertinent past medical history.  Childhood asthma.   History reviewed. No pertinent surgical history.   None   Current Outpatient Medications on File  Prior to Visit  Medication Sig Dispense Refill   albuterol (VENTOLIN HFA) 108 (90 Base) MCG/ACT inhaler INHALE 2 PUFFS INTO THE LUNGS EVERY 4 HOURS AS NEEDED (COUGH, SHORTNESS OF BREATH OR WHEEZING.). 18 g 1   fluticasone (FLONASE) 50 MCG/ACT nasal spray Place into the nose.     fluticasone-salmeterol (ADVAIR) 250-50 MCG/ACT AEPB Inhale 1 puff into the lungs in the morning and at bedtime. 60 each 1   No current facility-administered medications on file prior to visit.    No Known Allergies   DIAGNOSTIC DATA (LABS,  IMAGING, TESTING) - I reviewed patient records, labs, notes, testing and imaging myself where available.  Lab Results  Component Value Date   WBC 9.7 09/10/2021   HGB 16.4 09/10/2021   HCT 47.5 09/10/2021   MCV 87.7 09/10/2021   PLT 255.0 09/10/2021      Component Value Date/Time   NA 140 09/10/2021 1515   NA 138 02/03/2018 1338   K 4.0 09/10/2021 1515   CL 100 09/10/2021 1515   CO2 28 09/10/2021 1515   GLUCOSE 81 09/10/2021 1515   BUN 12 09/10/2021 1515   BUN 14 02/03/2018 1338   CREATININE 0.91 09/10/2021 1515   CALCIUM 9.8 09/10/2021 1515   PROT 7.3 09/10/2021 1515   PROT 7.3 02/03/2018 1338   ALBUMIN 4.8 09/10/2021 1515   ALBUMIN 4.7 02/03/2018 1338   AST 18 09/10/2021 1515   ALT 22 09/10/2021 1515   ALKPHOS 81 09/10/2021 1515   BILITOT 0.9 09/10/2021 1515   BILITOT 0.8 02/03/2018 1338   GFRNONAA 123 02/03/2018 1338   GFRAA 142 02/03/2018 1338   Lab Results  Component Value Date   CHOL 220 (H) 09/10/2021   HDL 41.40 09/10/2021   LDLCALC Comment 02/03/2018   LDLDIRECT 168.0 09/10/2021   TRIG 213.0 (H) 09/10/2021   CHOLHDL 5 09/10/2021   Lab Results  Component Value Date   HGBA1C 5.2 09/10/2021   No results found for: "VITAMINB12" Lab Results  Component Value Date   TSH 0.97 09/10/2021    PHYSICAL EXAM:  Today's Vitals   04/22/23 1303  BP: 134/85  Pulse: 82  Weight: 285 lb (129.3 kg)  Height: 6' (1.829 m)   Body mass index is 38.65  kg/m.   Wt Readings from Last 3 Encounters:  04/22/23 285 lb (129.3 kg)  03/19/23 276 lb 6.4 oz (125.4 kg)  03/20/22 291 lb 3.2 oz (132.1 kg)     Ht Readings from Last 3 Encounters:  04/22/23 6' (1.829 m)  03/19/23 6' (1.829 m)  03/20/22 6' (1.829 m)      General: The patient is awake, alert and appears not in acute distress. The patient is well groomed. Head: Normocephalic, atraumatic. Neck is supple.  Mallampati  2-3 , with  elongated , deeply red uvula. ,  facial, hair.  neck circumference:18 inches . Nasal airflow  patent.   Retrognathia is  seen.  Dental status:  wore braces.  Cardiovascular:  Regular rate and cardiac rhythm by pulse,  without distended neck veins. Respiratory: Lungs are clear to auscultation.  Skin:  Without evidence of ankle edema, or rash. Trunk: The patient's posture is erect.   NEUROLOGIC EXAM: The patient is awake and alert, oriented to place and time.   Memory subjective described as intact.  Attention span & concentration ability appears normal.  Speech is fluent,  without  dysarthria, dysphonia or aphasia.  Mood and affect are appropriate.   Cranial nerves: no loss of smell or taste reported  Pupils are equal and briskly reactive to light. Funduscopic exam deferred..  Extraocular movements in vertical and horizontal planes were intact and without nystagmus. No Diplopia. Visual fields by finger perimetry are intact. Hearing was intact to soft voice and finger rubbing.  Facial sensation intact to fine touch. Facial motor strength is symmetric and tongue and uvula move midline.  Neck ROM : rotation, tilt and flexion extension were normal for age and shoulder shrug was symmetrical.    Motor exam:  Symmetric bulk, tone and ROM.   Normal tone without  cog-  wheeling, symmetric grip strength .   Sensory:  Fine touch, pinprick and vibration were tested  and  normal.  Proprioception tested in the upper extremities was normal.   Coordination: Rapid  alternating movements in the fingers/hands were of normal speed.  The Finger-to-nose maneuver was intact without evidence of ataxia, dysmetria or tremor.   Gait and station: Patient could rise unassisted from a seated position, walked without assistive device.  Stance is of normal width/ base .  Toe and heel walk were deferred.  Deep tendon reflexes: in the upper and lower extremities are symmetric and intact.  Babinski response was deferred    ASSESSMENT AND PLAN 33 y.o. male here with:    1) I have the pleasure of meeting today with Mr. Tayten and Bloom a 32 year old Caucasian gentleman who has been snoring since his teenage but recently this has become a problem in his relationship.  He does not see a correlation to any major weight gain, but he did state that he went on a trip to [place] where he could share the bedroom with his girlfriend with no problems and that the snoring became more of a bother after he contracted some upper airway infection.  He was treated with steroids and antibiotics and is currently still on Advair aerosol powder as needed and Ventolin inhaler.  So I do think that he may have had an asthmatic component and this may also explain the louder snoring he developed symptoms.  In terms of daytime sleepiness or daytime fatigue there are no high values and certainly none that raises a red flag for driving safety or work safety.    My goal is to screen Mr. Wilma Flavin for sleep apnea and I will do this by a home sleep test this will be enough to measure    if apnea is present, to which degree if it is positional dependent, if it is REM sleep dependent, and I will also suggest some medication at night to prevent him from having apneas related to acid reflux.  2) HST ordered   3)  Acid reflux treatment with prevacid at night for 30 days.   4) restrict late night meals, keep 3 hours between food and sleep.    5) restrict late night alcohol intake   RV after HST-  plan to  follow up either personally or through our NP within 2-5 months.   I would like to thank  Shade Flood, Md [address] [place],  [place] [address] for allowing me to meet with and to take care of this pleasant patient.  .  After spending a total time of  35  minutes face to face and additional time for physical and neurologic examination, review of laboratory studies,  personal review of imaging studies, reports and results of other testing and review of referral information / records as far as provided in visit,   Electronically signed by: Melvyn Novas, MD 04/22/2023 1:17 PM  [facility] Board certified by The [employer] of Sleep Medicine and Diplomate of the [employer] of Sleep Medicine. Board certified In Neurology through the ABPN, Fellow of the [employer] of Neurology.

## 2023-05-06 ENCOUNTER — Ambulatory Visit: Payer: 59 | Admitting: Neurology

## 2023-05-06 DIAGNOSIS — K219 Gastro-esophageal reflux disease without esophagitis: Secondary | ICD-10-CM

## 2023-05-06 DIAGNOSIS — R0683 Snoring: Secondary | ICD-10-CM

## 2023-05-06 DIAGNOSIS — Z9189 Other specified personal risk factors, not elsewhere classified: Secondary | ICD-10-CM

## 2023-05-06 DIAGNOSIS — E669 Obesity, unspecified: Secondary | ICD-10-CM

## 2023-05-06 DIAGNOSIS — G4733 Obstructive sleep apnea (adult) (pediatric): Secondary | ICD-10-CM

## 2023-05-13 NOTE — Addendum Note (Signed)
Addended by: Melvyn Novas on: 05/13/2023 05:13 PM   Modules accepted: Orders

## 2023-05-13 NOTE — Procedures (Signed)
[facility]   HOME SLEEP TEST REPORT ( Mail out- by Watch PAT)   STUDY DATA:  05-13-2023  Eric Payne 32 year old male November 30, 1990   ORDERING CLINICIAN: Melvyn Novas, MD  REFERRING CLINICIAN: Meredith Staggers, MD    CLINICAL INFORMATION/HISTORY: Eric Payne is a 32 y.o. male NEW patient who is seen upon referral on 04/22/2023 from Dr Neva Seat for a sleep consultation. .  Chief concern : " he feels not daytime sleepy, wakes not refreshed, never has been " a morning person" , he may sleep through multiple alarms".        Epworth sleepiness score: 7 /24. FSS at  29/63    BMI: 38.5 kg/m   Neck Circumference: 18"   FINDINGS:   Sleep Summary:   Total Recording Time (hours, min): 7 hours 22 minutes        Total Sleep Time (hours, min):    6 hours 51 minutes             Percent REM (%): 15.9%                                      Respiratory Indices:   Calculated pAHI (per hour):    66.8/h                         REM pAHI:   58.5/h                                              NREM pAHI:    68.3/h                          Positional AHI:   The patient slept for most of the night in supine position a total of 320 minutes were recorded associated with an AHI of 73.5/h.  This was followed by left lateral sleep for 39 minutes associated with an AHI of 55.8.  Snoring with a mean volume of 44 dB which is considered loud, snoring was present for 64.6% of total sleep time and reached over 60 dB at peak time.                                                Oxygen Saturation Statistics::            O2 Saturation Range (%): Between the nadir of 78 and a maximum saturation of 99% with a mean saturation of 92%                                      O2 Saturation (minutes) <89%:    20.2 minutes O2 saturation ( minutes) <90%:   34 minutes       Pulse Rate Statistics:   Pulse Mean (bpm):     60 bpm            Pulse Range:     Between a minimum of 42 and the maximum  108 bpm.  IMPRESSION:  This HST confirms the presence of severe sleep apnea which the device calculated as obstructive sleep apnea only.  There were frequent desaturations associated with the frequent apneas over 300 of these desaturation events were present. The nadir of oxygen saturation was 78% and is considered severe.   RECOMMENDATION: Severe obstructive sleep apnea associated with severe hypoxemia needs to be treated with positive airway pressure.  This can be CPAP or BiPAP or even an ASV machine.  In the name of time I will order an autotitration CPAP device between 5 and 20 cm water pressure with 3 cm EPR, heated humidification and I will ask the patient to try several masks in the supine position or at least in reclined position.  Weight loss will be helpful to reduce the overall apnea and and will influence snoring as well.  Morning headaches hopefully will respond to the improved oxygenation and oxygen saturation at night.  Revisit to be scheduled in the sleep clinic after 30 days of therapy before the 89-day of therapy.    INTERPRETING PHYSICIAN:   Melvyn Novas, MD

## 2023-05-13 NOTE — Progress Notes (Signed)
[facility]   HOME SLEEP TEST REPORT ( Mail out- by Watch PAT)   STUDY DATA:  05-13-2023  [name] 32 year old male November 30, 1990   ORDERING CLINICIAN: Melvyn Novas, MD  REFERRING CLINICIAN: Meredith Staggers, MD    CLINICAL INFORMATION/HISTORY: Eric Payne is a 32 y.o. male NEW patient who is seen upon referral on 04/22/2023 from Dr Neva Seat for a sleep consultation. .  Chief concern : " he feels not daytime sleepy, wakes not refreshed, never has been " a morning person" , he may sleep through multiple alarms".        Epworth sleepiness score: 7 /24. FSS at  29/63    BMI: 38.5 kg/m   Neck Circumference: 18"   FINDINGS:   Sleep Summary:   Total Recording Time (hours, min): 7 hours 22 minutes        Total Sleep Time (hours, min):    6 hours 51 minutes             Percent REM (%): 15.9%                                      Respiratory Indices:   Calculated pAHI (per hour):    66.8/h                         REM pAHI:   58.5/h                                              NREM pAHI:    68.3/h                          Positional AHI:   The patient slept for most of the night in supine position a total of 320 minutes were recorded associated with an AHI of 73.5/h.  This was followed by left lateral sleep for 39 minutes associated with an AHI of 55.8.  Snoring with a mean volume of 44 dB which is considered loud, snoring was present for 64.6% of total sleep time and reached over 60 dB at peak time.                                                Oxygen Saturation Statistics::            O2 Saturation Range (%): Between the nadir of 78 and a maximum saturation of 99% with a mean saturation of 92%                                      O2 Saturation (minutes) <89%:    20.2 minutes O2 saturation ( minutes) <90%:   34 minutes       Pulse Rate Statistics:   Pulse Mean (bpm):     60 bpm            Pulse Range:     Between a minimum of 42 and the maximum  108 bpm.  IMPRESSION:  This HST confirms the presence of severe sleep apnea which the device calculated as obstructive sleep apnea only.  There were frequent desaturations associated with the frequent apneas over 300 of these desaturation events were present. The nadir of oxygen saturation was 78% and is considered severe.   RECOMMENDATION: Severe obstructive sleep apnea associated with severe hypoxemia needs to be treated with positive airway pressure.  This can be CPAP or BiPAP or even an ASV machine.  In the name of time I will order an autotitration CPAP device between 5 and 20 cm water pressure with 3 cm EPR, heated humidification and I will ask the patient to try several masks in the supine position or at least in reclined position.  Weight loss will be helpful to reduce the overall apnea and and will influence snoring as well.  Morning headaches hopefully will respond to the improved oxygenation and oxygen saturation at night.  Revisit to be scheduled in the sleep clinic after 30 days of therapy before the 89-day of therapy.    INTERPRETING PHYSICIAN:   Melvyn Novas, MD

## 2023-05-14 ENCOUNTER — Telehealth: Payer: Self-pay | Admitting: Neurology

## 2023-05-14 NOTE — Telephone Encounter (Signed)
I called pt. I advised pt that Dr. Vickey Huger reviewed their sleep study results and found that has severe OSA. Dr. Vickey Huger recommends that pt starts auto CPAP. I reviewed PAP compliance expectations with the pt. Pt is agreeable to starting a CPAP. I advised pt that an order will be sent to a DME, [facility], and [facility] will call the pt within about one week after they file with the pt's insurance. [facility] will show the pt how to use the machine, fit for masks, and troubleshoot the CPAP if needed. A follow up appt was made for insurance purposes with Ihor Austin on 07/21/2023 at 10:45 am. Pt verbalized understanding to arrive 15 minutes early and bring their CPAP. Pt verbalized understanding of results. Pt had no questions at this time but was encouraged to call back if questions arise. I have sent the order to [facility] and have received confirmation that they have received the order.

## 2023-05-14 NOTE — Telephone Encounter (Signed)
-----   Message from Manchester Dohmeier sent at 05/13/2023  5:13 PM EDT ----- Calculated pAHI (per hour):    66.8/h      O2 Saturation (minutes) <89%:    20.2 minutes O2 saturation ( minutes) <90%:   34 minutes    CPAP was ordered , weight loss is also needed.

## 2023-07-21 ENCOUNTER — Ambulatory Visit: Payer: 59 | Admitting: Adult Health

## 2023-07-21 ENCOUNTER — Encounter: Payer: Self-pay | Admitting: Adult Health

## 2023-07-21 NOTE — Progress Notes (Deleted)
[facility] [address] [place]. [place] [address] [phone]       OFFICE FOLLOW UP NOTE  Mr. Eric Payne Date of Birth:  11-04-1990 Medical Record Number:  [id number]   Reason for visit: Initial CPAP follow-up    SUBJECTIVE:   CHIEF COMPLAINT:  No chief complaint on file.   Follow-up visit:  Prior visit:  Brief HPI:   Eric Payne is a 32 y.o. male who was initially evaluated by Dr. Vickey Huger for concern of underlying sleep disorder with complaints of daytime fatigue, morning headaches, nonrestorative sleep and snoring.  HST 05/06/2023 which showed severe sleep apnea with total AHI of 66.8/h associated with frequent desaturations associated with frequent apneas with O2 nadir of 78%.  AutoPap set up 06/03/2023.       Interval history:  Past 30-day compliance report shows satisfactory nightly usage of the lower greater than 4-hour usage at 57%.  Optimal residual AHI 3.2. high leak rate ***         ROS:   14 system review of systems performed and negative with exception of those listed in HPI  PMH: No past medical history on file.  PSH: No past surgical history on file.  Social History:  Social History   Socioeconomic History   Marital status: Single    Spouse name: Not on file   Number of children: Not on file   Years of education: Not on file   Highest education level: Not on file  Occupational History   Not on file  Tobacco Use   Smoking status: Former    Types: Cigars, Cigarettes   Smokeless tobacco: Current    Types: Chew   Tobacco comments:    chewing for 6 years  Substance and Sexual Activity   Alcohol use: Yes    Alcohol/week: 0.0 standard drinks of alcohol    Comment: beer   Drug use: Not on file   Sexual activity: Not on file  Other Topics Concern   Not on file  Social History Narrative   Not on file   Social Determinants of Health   Financial Resource Strain: Patient Declined (03/19/2023)   Overall  Financial Resource Strain (CARDIA)    Difficulty of Paying Living Expenses: Patient declined  Food Insecurity: Patient Declined (03/19/2023)   Hunger Vital Sign    Worried About Running Out of Food in the Last Year: Patient declined    Ran Out of Food in the Last Year: Patient declined  Transportation Needs: Patient Declined (03/19/2023)   PRAPARE - Administrator, Civil Service (Medical): Patient declined    Lack of Transportation (Non-Medical): Patient declined  Physical Activity: Unknown (03/19/2023)   Exercise Vital Sign    Days of Exercise per Week: Patient declined    Minutes of Exercise per Session: Not on file  Stress: Stress Concern Present (03/19/2023)   [employer] of Occupational Health - Occupational Stress Questionnaire    Feeling of Stress : To some extent  Social Connections: Unknown (03/19/2023)   Social Connection and Isolation Panel [NHANES]    Frequency of Communication with Friends and Family: Patient declined    Frequency of Social Gatherings with Friends and Family: Patient declined    Attends Religious Services: Patient declined    Database administrator or Organizations: Patient declined    Attends Banker Meetings: Not on file    Marital Status: Patient declined  Intimate Partner Violence: Not on file    Family  History: No family history on file.  Medications:   Current Outpatient Medications on File Prior to Visit  Medication Sig Dispense Refill   albuterol (VENTOLIN HFA) 108 (90 Base) MCG/ACT inhaler INHALE 2 PUFFS INTO THE LUNGS EVERY 4 HOURS AS NEEDED (COUGH, SHORTNESS OF BREATH OR WHEEZING.). 18 g 1   fluticasone (FLONASE) 50 MCG/ACT nasal spray Place into the nose.     fluticasone-salmeterol (ADVAIR) 250-50 MCG/ACT AEPB Inhale 1 puff into the lungs in the morning and at bedtime. 60 each 1   No current facility-administered medications on file prior to visit.    Allergies:  No Known Allergies    OBJECTIVE:  Physical  Exam  There were no vitals filed for this visit. There is no height or weight on file to calculate BMI. No results found.   General: well developed, well nourished, seated, in no evident distress Head: head normocephalic and atraumatic.   Neck: supple with no carotid or supraclavicular bruits Cardiovascular: regular rate and rhythm, no murmurs Musculoskeletal: no deformity Skin:  no rash/petichiae Vascular:  Normal pulses all extremities   Neurologic Exam Mental Status: Awake and fully alert. Oriented to place and time. Recent and remote memory intact. Attention span, concentration and fund of knowledge appropriate. Mood and affect appropriate.  Cranial Nerves: Pupils equal, briskly reactive to light. Extraocular movements full without nystagmus. Visual fields full to confrontation. Hearing intact. Facial sensation intact. Face, tongue, palate moves normally and symmetrically.  Motor: Normal bulk and tone. Normal strength in all tested extremity muscles Sensory.: intact to touch , pinprick , position and vibratory sensation.  Coordination: Rapid alternating movements normal in all extremities. Finger-to-nose and heel-to-shin performed accurately bilaterally. Gait and Station: Arises from chair without difficulty. Stance is normal. Gait demonstrates normal stride length and balance without use of AD. Tandem walk and heel toe without difficulty.  Reflexes: 1+ and symmetric. Toes downgoing.         ASSESSMENT/PLAN: Eric Payne is a 32 y.o. year old male    OSA on CPAP : Compliance report shows satisfactory nightly usage although lower >4 hrs usage ***.   Discussed continued nightly usage with ensuring greater than 4 hours nightly for optimal benefit and per insurance purposes.  Continue to follow with DME company for any needed supplies or CPAP related concerns     Follow up in *** or call earlier if needed   CC:  PCP: Shade Flood, MD    I spent *** minutes of  face-to-face and non-face-to-face time with patient.  This included previsit chart review, lab review, study review, order entry, electronic health record documentation, patient education and discussion regarding above diagnoses and treatment plan and answered all other questions to patient's satisfaction    Eric Payne, [facility]  [facility] [address] [place], [place] [address]  Phone [phone] Fax [phone] Note: This document was prepared with digital dictation and possible smart phrase technology. Any transcriptional errors that result from this process are unintentional.

## 2023-08-22 ENCOUNTER — Other Ambulatory Visit: Payer: Self-pay | Admitting: Family Medicine

## 2023-08-22 DIAGNOSIS — J9801 Acute bronchospasm: Secondary | ICD-10-CM

## 2024-07-30 ENCOUNTER — Other Ambulatory Visit: Payer: Self-pay | Admitting: Family Medicine

## 2024-07-30 DIAGNOSIS — J9801 Acute bronchospasm: Secondary | ICD-10-CM

## 2024-10-25 ENCOUNTER — Encounter: Payer: Self-pay | Admitting: Family Medicine

## 2024-10-25 ENCOUNTER — Ambulatory Visit: Admitting: Family Medicine

## 2024-10-25 VITALS — BP 118/78 | HR 97 | Temp 98.3°F | Resp 16 | Ht 72.0 in | Wt 308.6 lb

## 2024-10-25 DIAGNOSIS — Z1329 Encounter for screening for other suspected endocrine disorder: Secondary | ICD-10-CM | POA: Diagnosis not present

## 2024-10-25 DIAGNOSIS — F419 Anxiety disorder, unspecified: Secondary | ICD-10-CM | POA: Diagnosis not present

## 2024-10-25 DIAGNOSIS — R41840 Attention and concentration deficit: Secondary | ICD-10-CM | POA: Diagnosis not present

## 2024-10-25 DIAGNOSIS — G4733 Obstructive sleep apnea (adult) (pediatric): Secondary | ICD-10-CM | POA: Diagnosis not present

## 2024-10-25 DIAGNOSIS — E785 Hyperlipidemia, unspecified: Secondary | ICD-10-CM

## 2024-10-25 DIAGNOSIS — Z131 Encounter for screening for diabetes mellitus: Secondary | ICD-10-CM

## 2024-10-25 LAB — LIPID PANEL
Cholesterol: 217 mg/dL — ABNORMAL HIGH (ref 28–200)
HDL: 48.9 mg/dL
LDL Cholesterol: 143 mg/dL — ABNORMAL HIGH (ref 10–99)
NonHDL: 167.77
Total CHOL/HDL Ratio: 4
Triglycerides: 124 mg/dL (ref 10.0–149.0)
VLDL: 24.8 mg/dL (ref 0.0–40.0)

## 2024-10-25 LAB — COMPREHENSIVE METABOLIC PANEL WITH GFR
ALT: 39 U/L (ref 3–53)
AST: 24 U/L (ref 5–37)
Albumin: 4.9 g/dL (ref 3.5–5.2)
Alkaline Phosphatase: 83 U/L (ref 39–117)
BUN: 9 mg/dL (ref 6–23)
CO2: 29 meq/L (ref 19–32)
Calcium: 10 mg/dL (ref 8.4–10.5)
Chloride: 100 meq/L (ref 96–112)
Creatinine, Ser: 0.72 mg/dL (ref 0.40–1.50)
GFR: 121.72 mL/min
Glucose, Bld: 88 mg/dL (ref 70–99)
Potassium: 4.5 meq/L (ref 3.5–5.1)
Sodium: 138 meq/L (ref 135–145)
Total Bilirubin: 0.9 mg/dL (ref 0.2–1.2)
Total Protein: 7.9 g/dL (ref 6.0–8.3)

## 2024-10-25 LAB — TSH: TSH: 1.17 u[IU]/mL (ref 0.35–5.50)

## 2024-10-25 LAB — HEMOGLOBIN A1C: Hgb A1c MFr Bld: 5.2 % (ref 4.6–6.5)

## 2024-10-25 MED ORDER — SERTRALINE HCL 25 MG PO TABS: 25.0000 mg | ORAL_TABLET | Freq: Every day | ORAL | 3 refills | Status: AC

## 2024-10-25 NOTE — Progress Notes (Unsigned)
 "  Subjective:  Patient ID: Eric Payne, male    DOB: 1990-10-14  Age: 34 y.o. MRN: [id number]  CC:  Chief Complaint  Patient presents with   ADHD    Would like to get on ADHD medication.    Weight Loss    Wants to discuss getting on wegovy. Would like the inject form     HPI STAVROS CAIL presents for acute concerns above.  Last seen in June 2024 for acute issue at that time.  Last physical with me in 2022.  34yo Rhett at home and baby on the way.   Concern regarding focus, possible ADD Trouble focusing for years. Notices in various environments. Distracted with conversation. 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 Has brought up to family in the past, dismissed. No prior formal testing or treatment.  Does feel anxious at times. For years. Overthinking things.  Felt better in past with use of marijuana.  Tried friends Adderall in HS  helped focus.  Father passed from drug overdose- wants to avoid addictive meds.      10/25/2024   10:07 AM 03/19/2023    8:57 AM 03/20/2022   11:38 AM 09/10/2021    1:55 PM 07/04/2021    1:50 PM  Depression screen PHQ 2/9  Decreased Interest 1 0 0 0 0  Down, Depressed, Hopeless 1 0 0 0 0  PHQ - 2 Score 2 0 0 0 0  Altered sleeping 1 0 0  0  Tired, decreased energy 1 0 0  1  Change in appetite 2 0 0  1  Feeling bad or failure about yourself  1 0 0  0  Trouble concentrating 2 0 0  0  Moving slowly or fidgety/restless 2 0 0  0  Suicidal thoughts 0 0 0  0  PHQ-9 Score 11 0  0   2   Difficult doing work/chores Somewhat difficult  Not difficult at all       Data saved with a previous flowsheet row definition       10/25/2024   10:08 AM 03/19/2023    8:58 AM  GAD 7 : Generalized Anxiety Score  Nervous, Anxious, on Edge 1 0  Control/stop worrying 2 0  Worry too much - different things 2 0  Trouble relaxing 2 0  Restless 0 0  Easily annoyed or irritable 2 0  Afraid - awful might happen 1 0   Total GAD 7 Score 10 0  Anxiety Difficulty Somewhat difficult     Morbid obesity Normal A1c of 5.2 in 2022.Appointment 07/14/2024 2.  TSH normal, LDL 168 at that time.  Total cholesterol 220, triglycerides 213, HDL 41. Severe OSA on sleep testing in 2024 - using CPAP nightly, feels rested.  Would like to consider Wegovy.  No prior weight loss meds. Has met with [facility], B12 and diet recommendations. 2024.  Otc fat burner only.  Exercise: physical work, on farm.  1 soda per day.  Fast food 3-5x, week.  No FH of MEN syndrome, thyroid  CA or hx of pancreatitis.      Wt Readings from Last 3 Encounters:  10/25/24 (!) 308 lb 9.6 oz (140 kg)  04/22/23 285 lb (129.3 kg)  03/19/23 276 lb 6.4 oz (125.4 kg)   Body mass index is 41.85 kg/m.    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 No pertinent past medical history. History reviewed. No pertinent surgical history. Allergies[1] Prior to Admission medications  Medication Sig Start Date End Date Taking? Authorizing Provider  albuterol  (VENTOLIN  HFA) 108 (90 Base) MCG/ACT inhaler INHALE 2 PUFFS INTO THE LUNGS UP TO EVERY 4HRS AS NEEDED FOR COUGH/SHORTNESS OF BREATH/WHEEZING 08/22/23  Yes Levora Reyes SAUNDERS, MD  fluticasone  [facility]) 50 MCG/ACT nasal spray Place into the nose. 12/06/16  Yes [provider]  fluticasone -salmeterol (ADVAIR) 250-50 MCG/ACT AEPB Inhale 1 puff into the lungs in the morning and at bedtime. Patient not taking: Reported on 10/25/2024 03/19/23   Levora Reyes SAUNDERS, MD   Social History   Socioeconomic History   Marital status: Single    Spouse name: Not on file   Number of  children: Not on file   Years of education: Not on file   Highest education level: Not on file  Occupational History   Not on file  Tobacco Use   Smoking status: Former    Types: Cigars, Cigarettes   Smokeless tobacco: Current    Types: Chew   Tobacco comments:    chewing for 6 years  Substance and Sexual Activity   Alcohol use: Yes    Alcohol/week: 0.0 standard drinks of alcohol    Comment: beer   Drug use: Not on file   Sexual activity: Not on file  Other Topics Concern   Not on file  Social History Narrative   Not on file   Social Drivers of Health   Tobacco Use: High Risk (10/25/2024)   Patient History    Smoking Tobacco Use: Former    Smokeless Tobacco Use: Current    Passive Exposure: Not on file  Financial Resource Strain: Patient Declined (03/19/2023)   Overall Financial Resource Strain (CARDIA)    Difficulty of Paying Living Expenses: Patient declined  Food Insecurity: Patient Declined (03/19/2023)   Hunger Vital Sign    Worried About Running Out of Food in the Last Year: Patient declined    Ran Out of Food in the Last Year: Patient declined  Transportation Needs: Patient Declined (03/19/2023)   PRAPARE - Administrator, Civil Service (Medical): Patient declined    Lack of Transportation (Non-Medical): Patient declined  Physical Activity: Unknown (03/19/2023)   Exercise Vital Sign    Days of Exercise per Week: Patient declined    Minutes of Exercise per Session: Not on file  Stress: Stress Concern Present (03/19/2023)   [employer] of Occupational Health - Occupational Stress Questionnaire    Feeling of Stress : To some extent  Social Connections: Unknown (03/19/2023)   Social Connection and Isolation Panel    Frequency of Communication with Friends and Family: Patient declined    Frequency of Social Gatherings with Friends and Family: Patient declined    Attends Religious Services: Patient declined    Active Member of Clubs or Organizations:  Patient declined    Attends Banker Meetings: Not on file    Marital Status: Patient declined  Intimate Partner Violence: Not on file  Depression (PHQ2-9): High Risk (10/25/2024)   Depression (PHQ2-9)    PHQ-2 Score: 11  Alcohol Screen: Medium Risk (03/19/2023)   Alcohol Screen    Last Alcohol Screening Score (AUDIT): 9  Housing: Low Risk (03/19/2023)   Housing    Last Housing Risk Score: 0  Utilities: Not on file  Health Literacy: Not on file    Review of Systems   Objective:   Vitals:   10/25/24 1001  BP: 118/78  Pulse: 97  Resp: 16  Temp: 98.3 F (36.8 C)  TempSrc: Temporal  SpO2: 97%  Weight: (!) 308 lb 9.6 oz (140 kg)  Height: 6' (1.829 m)     Physical Exam Vitals reviewed.  Constitutional:      Appearance: He is well-developed. He is obese.  HENT:     Head: Normocephalic and atraumatic.  Neck:     Vascular: No carotid bruit or JVD.  Cardiovascular:     Rate and Rhythm: Normal rate and regular rhythm.     Heart sounds: Normal heart sounds. No murmur heard. Pulmonary:     Effort: Pulmonary effort is normal.     Breath sounds: Normal breath sounds. No rales.  Musculoskeletal:     Right lower leg: No edema.     Left lower leg: No edema.  Skin:    General: Skin is warm and dry.  Neurological:     Mental Status: He is alert and oriented to person, place, and time.  Psychiatric:        Mood and Affect: Mood normal.      I personally spent a total of 40 minutes in the care of the patient today including preparing to see the patient, performing a medically appropriate exam/evaluation, counseling and educating, placing orders, and documenting clinical information in the EHR.   Assessment & Plan:  DEVONTE MIGUES is a 34 y.o. male . Anxiety - Plan: sertraline  (ZOLOFT ) 25 MG tablet Difficulty concentrating  - Although he certainly could have a component of ADD, we discussed the positive anxiety screening and possible impact of anxiety on  concentration/focus as well.  Ultimately decided to start with treatment of anxiety with low-dose SSRI, and if tolerated we will increase dosing at follow-up in the next 1 month.  Option of ADD testing, independent ADD provider information listed or I can refer him for testing if he would like.  1 month follow-up.  Morbid obesity (HCC) Hyperlipidemia, unspecified hyperlipidemia type - Plan: Comprehensive metabolic panel with GFR, Lipid panel OSA on CPAP Screening for diabetes mellitus - Plan: Hemoglobin A1c  - Dietary discussion regarding food choices, continue to stay active, check labs and adjust plan accordingly.  Will check A1c, TSH, lipids, CMP.  Can discuss GLP, GIP options for weight loss further at follow-up but we did briefly discuss potential side effects, risks and potential need for long-term treatment.  With history of sleep apnea zepbound may be initial approach.  Additionally provided option to meet with [facility] for comprehensive weight management.  1 month follow-up.  Screening for thyroid  disorder - Plan: TSH  -   Meds ordered this encounter  Medications   sertraline  (ZOLOFT ) 25 MG tablet    Sig: Take 1 tablet (25 mg total) by mouth daily.    Dispense:  30 tablet    Refill:  3   Patient Instructions  I do think there may be a component of ADD but with anxiety let's start treatment for that first. Here is an option for ADD testing as well: [facility] Address: [address], [place], [place] [address] Phone: [phone]  Start sertraline  once per day, virtual visit in 1 month.   It may  be best to check with your insurance to see if they do have coverage for weight loss medicines like Wegovy or Zepbound.  If not out-of-pocket through the actual company has become more cost effective, but still anywhere from $200-$500 per month.  We certainly can discuss that further at next visit.  I would consider meeting with [facility]  [facility]ness for a comprehensive weight loss program, here is their information.  However I am happy to discuss other weight loss approaches further at your follow-up visit in the month.  For now stay active, try to cut back on fast food and minimize soda intake.  Water is best.  [facility]  [phone]   If any concerns on your labs from today I will let you know.  If you have any questions from today's visit, please reach out.  Take care!  Managing Anxiety, Adult After being diagnosed with anxiety, you may be relieved to know why you have felt or behaved a certain way. You may also feel overwhelmed about the treatment ahead and what it will mean for your life. With care and support, you can manage your anxiety. How to manage lifestyle changes Understanding the difference between stress and anxiety Although stress can play a role in anxiety, it is not the same as anxiety. Stress is your body's reaction to life changes and events, both good and bad. Stress is often caused by something external, such as a deadline, test, or competition. It normally goes away after the event has ended and will last just a few hours. But, stress can be ongoing and can lead to more than just stress. Anxiety is caused by something internal, such as imagining a terrible outcome or worrying that something will go wrong that will greatly upset you. Anxiety often does not go away even after the event is over, and it can become a long-term (chronic) worry. Lowering stress and anxiety Talk with your health care provider or a counselor to learn more about lowering anxiety and stress. They may suggest tension-reduction techniques, such as: Music. Spend time creating or listening to music that you enjoy and that inspires you. Mindfulness-based meditation. Practice being aware of your normal breaths while not trying to control your breathing. It can be done while sitting or  walking. Centering prayer. Focus on a word, phrase, or sacred image that means something to you and brings you peace. Deep breathing. Expand your stomach and inhale slowly through your nose. Hold your breath for 3-5 seconds. Then breathe out slowly, letting your stomach muscles relax. Self-talk. Learn to notice and spot thought patterns that lead to anxiety reactions. Change those patterns to thoughts that feel peaceful. Muscle relaxation. Take time to tense muscles and then relax them. Choose a tension-reduction technique that fits your lifestyle and personality. These techniques take time and practice. Set aside 5-15 minutes a day to do them. Specialized therapists can offer counseling and training in these techniques. The training to help with anxiety may be covered by some insurance plans. Other things you can do to manage stress and anxiety include: Keeping a stress diary. This can help you learn what triggers your reaction and then learn ways to manage your response. Thinking about how you react to certain situations. You may not be able to control everything, but you can control your response. Making time for activities that help you relax and not feeling guilty about spending your time in this way. Doing visual imagery.  This involves imagining or creating mental pictures to help you relax. Practicing yoga. Through yoga poses, you can lower tension and relax.  Medicines Medicines for anxiety include: Antidepressant medicines. These are usually prescribed for long-term daily control. Anti-anxiety medicines. These may be added in severe cases, especially when panic attacks occur. When used together, medicines, psychotherapy, and tension-reduction techniques may be the most effective treatment. Relationships Relationships can play a big part in helping you recover. Spend more time connecting with trusted friends and family members. Think about going to couples counseling if you have a partner,  taking family education classes, or going to family therapy. Therapy can help you and others better understand your anxiety. How to recognize changes in your anxiety Everyone responds differently to treatment for anxiety. Recovery from anxiety happens when symptoms lessen and stop interfering with your daily life at home or work. This may mean that you will start to: Have better concentration and focus. Worry will interfere less in your daily thinking. Sleep better. Be less irritable. Have more energy. Have improved memory. Try to recognize when your condition is getting worse. Contact your provider if your symptoms interfere with home or work and you feel like your condition is not improving. Follow these instructions at home: Activity Exercise. Adults should: Exercise for at least 150 minutes each week. The exercise should increase your heart rate and make you sweat (moderate-intensity exercise). Do strengthening exercises at least twice a week. Get the right amount and quality of sleep. Most adults need 7-9 hours of sleep each night. Lifestyle  Eat a healthy diet that includes plenty of vegetables, fruits, whole grains, low-fat dairy products, and lean protein. Do not eat a lot of foods that are high in fats, added sugars, or salt (sodium). Make choices that simplify your life. Do not use any products that contain nicotine or tobacco. These products include cigarettes, chewing tobacco, and vaping devices, such as e-cigarettes. If you need help quitting, ask your provider. Avoid caffeine, alcohol, and certain over-the-counter cold medicines. These may make you feel worse. Ask your pharmacist which medicines to avoid. General instructions Take over-the-counter and prescription medicines only as told by your provider. Keep all follow-up visits. This is to make sure you are managing your anxiety well or if you need more support. Where to find support You can get help and support  from: Self-help groups. Online and [employer]. A trusted spiritual leader. Couples counseling. Family education classes. Family therapy. Where to find more information You may find that joining a support group helps you deal with your anxiety. The following sources can help you find counselors or support groups near you: Mental Health America: mentalhealthamerica.net Anxiety and Depression Association of America (ADAA): adaa.org The [employer] on Mental Illness (NAMI): nami.org Contact a health care provider if: You have a hard time staying focused or finishing tasks. You spend many hours a day feeling worried about everyday life. You are very tired because you cannot stop worrying. You start to have headaches or often feel tense. You have chronic nausea or diarrhea. Get help right away if: Your heart feels like it is racing. You have shortness of breath. You have thoughts of hurting yourself or others. Get help right away if you feel like you may hurt yourself or others, or have thoughts about taking your own life. Go to your nearest emergency room or: Call 911. Call the National Suicide Prevention Lifeline at 2055640490 or 988. This is open 24 hours a day. Text the  Crisis Text Line at (873) 519-9171. This information is not intended to replace advice given to you by your health care provider. Make sure you discuss any questions you have with your health care provider. Document Revised: 07/02/2022 Document Reviewed: 01/14/2021 Elsevier Patient Education  2024 Elsevier Inc.          Signed,   Reyes Pines, MD [facility], [facility] 10/25/24 10:45 AM      [1] No Known Allergies  "

## 2024-10-25 NOTE — Patient Instructions (Addendum)
 I do think there may be a component of ADD but with anxiety let's start treatment for that first. Here is an option for ADD testing as well: [facility] Address: [address], [place], [place] [address] Phone: [phone]  Start sertraline  once per day, virtual visit in 1 month.   It may be best to check with your insurance to see if they do have coverage for weight loss medicines like Wegovy or Zepbound.  If not out-of-pocket through the actual company has become more cost effective, but still anywhere from $200-$500 per month.  We certainly can discuss that further at next visit.  I would consider meeting with [facility] for a comprehensive weight loss program, here is their information.  However I am happy to discuss other weight loss approaches further at your follow-up visit in the month.  For now stay active, try to cut back on fast food and minimize soda intake.  Water is best.  [facility]  [phone]   If any concerns on your labs from today I will let you know.  If you have any questions from today's visit, please reach out.  Take care!  Managing Anxiety, Adult After being diagnosed with anxiety, you may be relieved to know why you have felt or behaved a certain way. You may also feel overwhelmed about the treatment ahead and what it will mean for your life. With care and support, you can manage your anxiety. How to manage lifestyle changes Understanding the difference between stress and anxiety Although stress can play a role in anxiety, it is not the same as anxiety. Stress is your body's reaction to life changes and events, both good and bad. Stress is often caused by something external, such as a deadline, test, or competition. It normally goes away after the event has ended and will last just a few hours. But, stress can be ongoing and can lead to more than just stress. Anxiety is caused by  something internal, such as imagining a terrible outcome or worrying that something will go wrong that will greatly upset you. Anxiety often does not go away even after the event is over, and it can become a long-term (chronic) worry. Lowering stress and anxiety Talk with your health care provider or a counselor to learn more about lowering anxiety and stress. They may suggest tension-reduction techniques, such as: Music. Spend time creating or listening to music that you enjoy and that inspires you. Mindfulness-based meditation. Practice being aware of your normal breaths while not trying to control your breathing. It can be done while sitting or walking. Centering prayer. Focus on a word, phrase, or sacred image that means something to you and brings you peace. Deep breathing. Expand your stomach and inhale slowly through your nose. Hold your breath for 3-5 seconds. Then breathe out slowly, letting your stomach muscles relax. Self-talk. Learn to notice and spot thought patterns that lead to anxiety reactions. Change those patterns to thoughts that feel peaceful. Muscle relaxation. Take time to tense muscles and then relax them. Choose a tension-reduction technique that fits your lifestyle and personality. These techniques take time and practice. Set aside 5-15 minutes a day to do them. Specialized therapists can offer counseling and training in these techniques. The training to help with anxiety may be covered by some insurance plans. Other things you can do to manage stress and anxiety include: Keeping a stress diary. This can help you  learn what triggers your reaction and then learn ways to manage your response. Thinking about how you react to certain situations. You may not be able to control everything, but you can control your response. Making time for activities that help you relax and not feeling guilty about spending your time in this way. Doing visual imagery. This involves imagining or  creating mental pictures to help you relax. Practicing yoga. Through yoga poses, you can lower tension and relax.  Medicines Medicines for anxiety include: Antidepressant medicines. These are usually prescribed for long-term daily control. Anti-anxiety medicines. These may be added in severe cases, especially when panic attacks occur. When used together, medicines, psychotherapy, and tension-reduction techniques may be the most effective treatment. Relationships Relationships can play a big part in helping you recover. Spend more time connecting with trusted friends and family members. Think about going to couples counseling if you have a partner, taking family education classes, or going to family therapy. Therapy can help you and others better understand your anxiety. How to recognize changes in your anxiety Everyone responds differently to treatment for anxiety. Recovery from anxiety happens when symptoms lessen and stop interfering with your daily life at home or work. This may mean that you will start to: Have better concentration and focus. Worry will interfere less in your daily thinking. Sleep better. Be less irritable. Have more energy. Have improved memory. Try to recognize when your condition is getting worse. Contact your provider if your symptoms interfere with home or work and you feel like your condition is not improving. Follow these instructions at home: Activity Exercise. Adults should: Exercise for at least 150 minutes each week. The exercise should increase your heart rate and make you sweat (moderate-intensity exercise). Do strengthening exercises at least twice a week. Get the right amount and quality of sleep. Most adults need 7-9 hours of sleep each night. Lifestyle  Eat a healthy diet that includes plenty of vegetables, fruits, whole grains, low-fat dairy products, and lean protein. Do not eat a lot of foods that are high in fats, added sugars, or salt  (sodium). Make choices that simplify your life. Do not use any products that contain nicotine or tobacco. These products include cigarettes, chewing tobacco, and vaping devices, such as e-cigarettes. If you need help quitting, ask your provider. Avoid caffeine, alcohol, and certain over-the-counter cold medicines. These may make you feel worse. Ask your pharmacist which medicines to avoid. General instructions Take over-the-counter and prescription medicines only as told by your provider. Keep all follow-up visits. This is to make sure you are managing your anxiety well or if you need more support. Where to find support You can get help and support from: Self-help groups. Online and [employer]. A trusted spiritual leader. Couples counseling. Family education classes. Family therapy. Where to find more information You may find that joining a support group helps you deal with your anxiety. The following sources can help you find counselors or support groups near you: Mental Health America: mentalhealthamerica.net Anxiety and Depression Association of America (ADAA): adaa.org The [employer] on Mental Illness (NAMI): nami.org Contact a health care provider if: You have a hard time staying focused or finishing tasks. You spend many hours a day feeling worried about everyday life. You are very tired because you cannot stop worrying. You start to have headaches or often feel tense. You have chronic nausea or diarrhea. Get help right away if: Your heart feels like it is racing. You have shortness of breath. You  have thoughts of hurting yourself or others. Get help right away if you feel like you may hurt yourself or others, or have thoughts about taking your own life. Go to your nearest emergency room or: Call 911. Call the National Suicide Prevention Lifeline at (351) 247-1051 or 988. This is open 24 hours a day. Text the Crisis Text Line at (463)829-5211. This information is not  intended to replace advice given to you by your health care provider. Make sure you discuss any questions you have with your health care provider. Document Revised: 07/02/2022 Document Reviewed: 01/14/2021 Elsevier Patient Education  2024 [employer].

## 2024-10-29 ENCOUNTER — Ambulatory Visit: Payer: Self-pay | Admitting: Family Medicine

## 2024-11-04 NOTE — Progress Notes (Signed)
 Lab results have been discussed.   Verbalized understanding? Yes  Are there any questions? No

## 2024-11-25 ENCOUNTER — Ambulatory Visit: Admitting: Family Medicine
